# Patient Record
Sex: Male | Born: 1955 | Race: Black or African American | Hispanic: No | Marital: Single | Smoking: Former smoker
Health system: Southern US, Community
[De-identification: ages and names within clinical notes are randomized; demographics above are authoritative.]

## PROBLEM LIST (undated history)

## (undated) DIAGNOSIS — I10 Essential (primary) hypertension: Secondary | ICD-10-CM

## (undated) DIAGNOSIS — I739 Peripheral vascular disease, unspecified: Secondary | ICD-10-CM

## (undated) DIAGNOSIS — N183 Chronic kidney disease, stage 3 unspecified: Secondary | ICD-10-CM

## (undated) DIAGNOSIS — R51 Headache: Secondary | ICD-10-CM

## (undated) DIAGNOSIS — J189 Pneumonia, unspecified organism: Secondary | ICD-10-CM

## (undated) DIAGNOSIS — F419 Anxiety disorder, unspecified: Secondary | ICD-10-CM

## (undated) DIAGNOSIS — M1990 Unspecified osteoarthritis, unspecified site: Secondary | ICD-10-CM

## (undated) DIAGNOSIS — F329 Major depressive disorder, single episode, unspecified: Secondary | ICD-10-CM

## (undated) DIAGNOSIS — F32A Depression, unspecified: Secondary | ICD-10-CM

## (undated) DIAGNOSIS — E119 Type 2 diabetes mellitus without complications: Secondary | ICD-10-CM

## (undated) DIAGNOSIS — F319 Bipolar disorder, unspecified: Secondary | ICD-10-CM

## (undated) DIAGNOSIS — G4730 Sleep apnea, unspecified: Secondary | ICD-10-CM

## (undated) HISTORY — PX: JOINT REPLACEMENT: SHX530

## (undated) HISTORY — PX: AMPUTATION: SHX166

## (undated) HISTORY — PX: CARPAL TUNNEL RELEASE: SHX101

## (undated) HISTORY — PX: ULNAR NERVE REPAIR: SHX2594

## (undated) HISTORY — PX: EYE SURGERY: SHX253

---

## 1997-12-16 ENCOUNTER — Ambulatory Visit (HOSPITAL_COMMUNITY): Admission: RE | Admit: 1997-12-16 | Discharge: 1997-12-16 | Payer: Self-pay | Admitting: *Deleted

## 1998-02-06 ENCOUNTER — Ambulatory Visit (HOSPITAL_COMMUNITY): Admission: RE | Admit: 1998-02-06 | Discharge: 1998-02-06 | Payer: Self-pay | Admitting: *Deleted

## 1998-08-18 ENCOUNTER — Ambulatory Visit (HOSPITAL_COMMUNITY): Admission: RE | Admit: 1998-08-18 | Discharge: 1998-08-18 | Payer: Self-pay | Admitting: *Deleted

## 1999-02-18 ENCOUNTER — Ambulatory Visit (HOSPITAL_COMMUNITY): Admission: RE | Admit: 1999-02-18 | Discharge: 1999-02-18 | Payer: Self-pay | Admitting: Neurosurgery

## 1999-02-23 ENCOUNTER — Encounter: Admission: RE | Admit: 1999-02-23 | Discharge: 1999-05-24 | Payer: Self-pay | Admitting: Endocrinology

## 1999-04-20 ENCOUNTER — Observation Stay (HOSPITAL_COMMUNITY): Admission: RE | Admit: 1999-04-20 | Discharge: 1999-04-21 | Payer: Self-pay | Admitting: Neurosurgery

## 1999-08-11 ENCOUNTER — Encounter: Admission: RE | Admit: 1999-08-11 | Discharge: 1999-11-09 | Payer: Self-pay | Admitting: Neurosurgery

## 1999-09-07 ENCOUNTER — Encounter: Admission: RE | Admit: 1999-09-07 | Discharge: 1999-09-07 | Payer: Self-pay | Admitting: Neurosurgery

## 1999-10-15 ENCOUNTER — Ambulatory Visit (HOSPITAL_COMMUNITY): Admission: RE | Admit: 1999-10-15 | Discharge: 1999-10-15 | Payer: Self-pay | Admitting: Neurosurgery

## 1999-11-20 ENCOUNTER — Encounter: Admission: RE | Admit: 1999-11-20 | Discharge: 1999-11-20 | Payer: Self-pay | Admitting: Neurosurgery

## 2000-01-19 ENCOUNTER — Encounter: Admission: RE | Admit: 2000-01-19 | Discharge: 2000-01-19 | Payer: Self-pay | Admitting: Neurosurgery

## 2000-04-20 ENCOUNTER — Encounter: Admission: RE | Admit: 2000-04-20 | Discharge: 2000-04-20 | Payer: Self-pay | Admitting: Neurosurgery

## 2000-07-19 ENCOUNTER — Encounter: Admission: RE | Admit: 2000-07-19 | Discharge: 2000-07-19 | Payer: Self-pay | Admitting: Neurosurgery

## 2000-10-21 ENCOUNTER — Encounter: Admission: RE | Admit: 2000-10-21 | Discharge: 2000-10-21 | Payer: Self-pay | Admitting: Neurosurgery

## 2000-11-08 HISTORY — PX: ANTERIOR CERVICAL DECOMP/DISCECTOMY FUSION: SHX1161

## 2014-07-16 ENCOUNTER — Other Ambulatory Visit (HOSPITAL_COMMUNITY): Payer: Self-pay | Admitting: Neurosurgery

## 2014-08-03 NOTE — Pre-Procedure Instructions (Signed)
Ronald Atkinson  08/03/2014   Your procedure is scheduled on:  Mon, Oct 5 @ 9:30 AM  Report to [facility]  at 6:30 AM.  Call this number if you have problems the morning of surgery: [phone]   Remember:   Do not eat food or drink liquids after midnight.   Take these medicines the morning of surgery with A SIP OF WATER: Gabapentin,  Wellbutrin               No Goody's,BC's,Aleve,Aspirin,Ibuprofen,Fish Oil,or any Herbal Medications   Do not wear jewelry  Do not wear lotions, powders, or colognes. You may wear deodorant.             Men may shave face and neck.  Do not bring valuables to the hospital.  [facility] is not responsible  for any belongings or valuables.               Contacts, dentures or bridgework may not be worn into surgery.  Leave suitcase in the car. After surgery it may be brought to your room.  For patients admitted to the hospital, discharge time is determined by your treatment team.                  Special Instructions:  [place] - Preparing for Surgery  Before surgery, you can play an important role.  Because skin is not sterile, your skin needs to be as free of germs as possible.  You can reduce the number of germs on you skin by washing with CHG (chlorahexidine gluconate) soap before surgery.  CHG is an antiseptic cleaner which kills germs and bonds with the skin to continue killing germs even after washing.  Please DO NOT use if you have an allergy to CHG or antibacterial soaps.  If your skin becomes reddened/irritated stop using the CHG and inform your nurse when you arrive at Short Stay.  Do not shave (including legs and underarms) for at least 48 hours prior to the first CHG shower.  You may shave your face.  Please follow these instructions carefully:   1.  Shower with CHG Soap the night before surgery and the morning of Surgery.  2.  If you choose to wash your hair, wash your hair first as usual with your normal shampoo.  3.  After  you shampoo, rinse your hair and body thoroughly to remove the Shampoo.  4.  Use CHG as you would any other liquid soap.  You can apply chg directly to the skin and wash gently with scrungie or a clean washcloth.  5.  Apply the CHG Soap to your body ONLY FROM THE NECK DOWN.        Do not use on open wounds or open sores.  Avoid contact with your eyes, ears, mouth and genitals (private parts).  Wash genitals (private parts) with your normal soap.  6.  Wash thoroughly, paying special attention to the area where your surgery        will be performed.  7.  Thoroughly rinse your body with warm water from the neck down.  8.  DO NOT shower/wash with your normal soap after using and rinsing off the CHG Soap.  9.  Pat yourself dry with a clean towel.            10.  Wear clean pajamas.            11.  Place clean sheets on your bed  the night of your first shower and do not sleep with pets.  Day of Surgery  Do not apply any lotions/deoderants the morning of surgery.  Please wear clean clothes to the hospital/surgery center.     Please read over the following fact sheets that you were given: Pain Booklet, Coughing and Deep Breathing, MRSA Information and Surgical Site Infection Prevention

## 2014-08-05 ENCOUNTER — Encounter (HOSPITAL_COMMUNITY)
Admission: RE | Admit: 2014-08-05 | Discharge: 2014-08-05 | Disposition: A | Discharge status: Home / Self Care | Payer: Non-veteran care | Source: Ambulatory Visit | Attending: Neurosurgery | Admitting: Neurosurgery

## 2014-08-05 ENCOUNTER — Ambulatory Visit (HOSPITAL_COMMUNITY)
Admission: RE | Admit: 2014-08-05 | Discharge: 2014-08-05 | Disposition: A | Discharge status: Home / Self Care | Payer: Non-veteran care | Source: Ambulatory Visit | Attending: Neurosurgery | Admitting: Neurosurgery

## 2014-08-05 ENCOUNTER — Encounter (HOSPITAL_COMMUNITY): Payer: Self-pay

## 2014-08-05 DIAGNOSIS — Z01818 Encounter for other preprocedural examination: Secondary | ICD-10-CM | POA: Insufficient documentation

## 2014-08-05 HISTORY — DX: Headache: R51

## 2014-08-05 HISTORY — DX: Type 2 diabetes mellitus without complications: E11.9

## 2014-08-05 HISTORY — DX: Depression, unspecified: F32.A

## 2014-08-05 HISTORY — DX: Major depressive disorder, single episode, unspecified: F32.9

## 2014-08-05 HISTORY — DX: Unspecified osteoarthritis, unspecified site: M19.90

## 2014-08-05 LAB — BASIC METABOLIC PANEL
Anion gap: 12 (ref 5–15)
BUN: 13 mg/dL (ref 6–23)
CO2: 25 mEq/L (ref 19–32)
CREATININE: 0.92 mg/dL (ref 0.50–1.35)
Calcium: 9.2 mg/dL (ref 8.4–10.5)
Chloride: 102 mEq/L (ref 96–112)
GFR calc Af Amer: >90 mL/min (ref >90)
Glucose, Bld: 207 mg/dL — ABNORMAL HIGH (ref 70–99)
Potassium: 4.4 mEq/L (ref 3.7–5.3)
Sodium: 139 mEq/L (ref 137–147)

## 2014-08-05 LAB — CBC
HCT: 39 % (ref 39.0–52.0)
Hemoglobin: 13.3 g/dL (ref 13.0–17.0)
MCH: 28.7 pg (ref 26.0–34.0)
MCHC: 34.1 g/dL (ref 30.0–36.0)
MCV: 84.2 fL (ref 78.0–100.0)
PLATELETS: 283 10*3/uL (ref 150–400)
RBC: 4.63 MIL/uL (ref 4.22–5.81)
RDW: 15 % (ref 11.5–15.5)
WBC: 7.5 10*3/uL (ref 4.0–10.5)

## 2014-08-05 LAB — SURGICAL PCR SCREEN
MRSA, PCR: NEGATIVE
Staphylococcus aureus: NEGATIVE

## 2014-08-05 NOTE — Progress Notes (Signed)
08/05/14 1029  OBSTRUCTIVE SLEEP APNEA  Have you ever been diagnosed with sleep apnea through a sleep study? No  Do you snore loudly (loud enough to be heard through closed doors)?  1  Do you often feel tired, fatigued, or sleepy during the daytime? 1  Has anyone observed you stop breathing during your sleep? 0  Do you have, or are you being treated for high blood pressure? 0  BMI more than 35 kg/m2? 1  Age over 56 years old? 1  Neck circumference greater than 40 cm/16 inches? 1  Gender: 1  Obstructive Sleep Apnea Score 6  Score 4 or greater  Results sent to PCP

## 2014-08-06 NOTE — Progress Notes (Signed)
Anesthesia Chart Review: Patient is a 58 year old male scheduled for C4-5 ACDF on 08/12/14 by Dr. Lovell SheehanJenkins.  History includes non-smoker, DM2, arthritis, headaches, depression.  BMI is consistent with obesity. No PCP is listed.   Preoperative CXR and labs noted. He will get a fasting CBG on arrival.  EKG on 08/05/14 showed: NSR, LAD, consider LAFB. Moderate voltage criteria for LVH, may be a normal variant.  Non-specific T wave abnormality. Currently, the last comparison EKG is in [place] and is from 04/20/99. LAD is new since then. No CV symptoms were documented from his PAT visit. He will be further evaluated by his assigned anesthesiologist on the day of surgery, but if no acute changes or new CV symptoms then I would anticipate that he could proceed as planned.  Velna Ochsllison Quaniya Damas, PA-C [facility]Anesthesiology Phone [phone] 08/06/2014 5:12 PM

## 2014-08-11 MED ORDER — DEXTROSE 5 % IV SOLN
3.0000 g | INTRAVENOUS | Status: DC
  Filled 2014-08-11: qty 3000

## 2014-08-11 NOTE — Anesthesia Preprocedure Evaluation (Addendum)
Anesthesia Evaluation  Patient identified by MRN, date of birth, ID band Patient awake    Reviewed: Allergy & Precautions, H&P , NPO status , Patient's Chart, lab work & pertinent test results  Airway Mallampati: II      Dental  (+) Partial Upper   Pulmonary  breath sounds clear to auscultation        Cardiovascular Rhythm:Regular Rate:Normal  Denies symptoms   Neuro/Psych  Headaches, Depression    GI/Hepatic   Endo/Other  diabetes, Poorly Controlled, Type 2, Insulin Dependent  Renal/GU      Musculoskeletal   Abdominal (+) + obese,   Peds  Hematology   Anesthesia Other Findings   Reproductive/Obstetrics                          Anesthesia Physical Anesthesia Plan  ASA: III  Anesthesia Plan: General   Post-op Pain Management:    Induction: Intravenous  Airway Management Planned: Oral ETT and Video Laryngoscope Planned  Additional Equipment:   Intra-op Plan:   Post-operative Plan: Extubation in OR  Informed Consent: I have reviewed the patients History and Physical, chart, labs and discussed the procedure including the risks, benefits and alternatives for the proposed anesthesia with the patient or authorized representative who has indicated his/her understanding and acceptance.     Plan Discussed with:   Anesthesia Plan Comments: (Glide scope, suspect OSA, nasal trumpet after intubation for emergence)        Anesthesia Quick Evaluation

## 2014-08-12 ENCOUNTER — Ambulatory Visit (HOSPITAL_COMMUNITY): Payer: Non-veteran care | Admitting: Anesthesiology

## 2014-08-12 ENCOUNTER — Encounter (HOSPITAL_COMMUNITY)
Admission: RE | Disposition: A | Discharge status: Home / Self Care | Payer: Self-pay | Source: Ambulatory Visit | Attending: Neurosurgery

## 2014-08-12 ENCOUNTER — Encounter (HOSPITAL_COMMUNITY): Payer: Non-veteran care | Admitting: Vascular Surgery

## 2014-08-12 ENCOUNTER — Ambulatory Visit (HOSPITAL_COMMUNITY)
Admission: RE | Admit: 2014-08-12 | Discharge: 2014-08-13 | Disposition: A | Discharge status: Home / Self Care | Payer: Non-veteran care | Source: Ambulatory Visit | Attending: Neurosurgery | Admitting: Neurosurgery

## 2014-08-12 ENCOUNTER — Ambulatory Visit (HOSPITAL_COMMUNITY): Payer: Non-veteran care

## 2014-08-12 ENCOUNTER — Encounter (HOSPITAL_COMMUNITY): Payer: Self-pay | Admitting: Surgery

## 2014-08-12 DIAGNOSIS — M4712 Other spondylosis with myelopathy, cervical region: Secondary | ICD-10-CM | POA: Diagnosis not present

## 2014-08-12 DIAGNOSIS — M4802 Spinal stenosis, cervical region: Secondary | ICD-10-CM | POA: Insufficient documentation

## 2014-08-12 DIAGNOSIS — M5012 Cervical disc disorder with radiculopathy, mid-cervical region: Secondary | ICD-10-CM | POA: Diagnosis not present

## 2014-08-12 DIAGNOSIS — F329 Major depressive disorder, single episode, unspecified: Secondary | ICD-10-CM | POA: Diagnosis not present

## 2014-08-12 DIAGNOSIS — E119 Type 2 diabetes mellitus without complications: Secondary | ICD-10-CM | POA: Insufficient documentation

## 2014-08-12 DIAGNOSIS — M4722 Other spondylosis with radiculopathy, cervical region: Secondary | ICD-10-CM | POA: Insufficient documentation

## 2014-08-12 DIAGNOSIS — E669 Obesity, unspecified: Secondary | ICD-10-CM | POA: Diagnosis not present

## 2014-08-12 DIAGNOSIS — M5002 Cervical disc disorder with myelopathy, mid-cervical region: Secondary | ICD-10-CM | POA: Diagnosis not present

## 2014-08-12 DIAGNOSIS — Z794 Long term (current) use of insulin: Secondary | ICD-10-CM | POA: Diagnosis not present

## 2014-08-12 DIAGNOSIS — Z981 Arthrodesis status: Secondary | ICD-10-CM | POA: Diagnosis not present

## 2014-08-12 HISTORY — PX: ANTERIOR CERVICAL DECOMP/DISCECTOMY FUSION: SHX1161

## 2014-08-12 LAB — GLUCOSE, CAPILLARY
GLUCOSE-CAPILLARY: 180 mg/dL — AB (ref 70–99)
GLUCOSE-CAPILLARY: 231 mg/dL — AB (ref 70–99)
GLUCOSE-CAPILLARY: 251 mg/dL — AB (ref 70–99)
Glucose-Capillary: 108 mg/dL — ABNORMAL HIGH (ref 70–99)
Glucose-Capillary: 106 mg/dL — ABNORMAL HIGH (ref 70–99)

## 2014-08-12 SURGERY — ANTERIOR CERVICAL DECOMPRESSION/DISCECTOMY FUSION 1 LEVEL/HARDWARE REMOVAL
Anesthesia: General

## 2014-08-12 MED ORDER — LIDOCAINE HCL (CARDIAC) 20 MG/ML IV SOLN
INTRAVENOUS | Status: AC
  Filled 2014-08-12: qty 5

## 2014-08-12 MED ORDER — ACETAMINOPHEN 325 MG PO TABS: 650.0000 mg | ORAL_TABLET | ORAL | Status: DC | PRN

## 2014-08-12 MED ORDER — DEXAMETHASONE SODIUM PHOSPHATE 4 MG/ML IJ SOLN
INTRAMUSCULAR | Status: DC | PRN
  Administered 2014-08-12: 8 mg via INTRAVENOUS

## 2014-08-12 MED ORDER — ONDANSETRON HCL 4 MG/2ML IJ SOLN
INTRAMUSCULAR | Status: DC | PRN
  Administered 2014-08-12: 4 mg via INTRAVENOUS

## 2014-08-12 MED ORDER — SODIUM CHLORIDE 0.9 % IJ SOLN
INTRAMUSCULAR | Status: AC
  Filled 2014-08-12: qty 10

## 2014-08-12 MED ORDER — NEOSTIGMINE METHYLSULFATE 10 MG/10ML IV SOLN
INTRAVENOUS | Status: AC
  Filled 2014-08-12: qty 1

## 2014-08-12 MED ORDER — SODIUM CHLORIDE 0.9 % IV SOLN
10.0000 mg | INTRAVENOUS | Status: DC | PRN
  Administered 2014-08-12: 25 ug/min via INTRAVENOUS

## 2014-08-12 MED ORDER — MENTHOL 3 MG MT LOZG: 1.0000 | LOZENGE | OROMUCOSAL | Status: DC | PRN

## 2014-08-12 MED ORDER — DEXAMETHASONE SODIUM PHOSPHATE 4 MG/ML IJ SOLN: 4.0000 mg | Freq: Four times a day (QID) | INTRAMUSCULAR | Status: AC

## 2014-08-12 MED ORDER — FENTANYL CITRATE 0.05 MG/ML IJ SOLN
INTRAMUSCULAR | Status: AC
  Filled 2014-08-12: qty 5

## 2014-08-12 MED ORDER — GABAPENTIN 800 MG PO TABS
800.0000 mg | ORAL_TABLET | Freq: Two times a day (BID) | ORAL | Status: DC
  Filled 2014-08-12: qty 1

## 2014-08-12 MED ORDER — MEPERIDINE HCL 25 MG/ML IJ SOLN: 6.2500 mg | INTRAMUSCULAR | Status: DC | PRN

## 2014-08-12 MED ORDER — ACETAMINOPHEN 10 MG/ML IV SOLN
INTRAVENOUS | Status: AC
  Administered 2014-08-12: 1000 mg via INTRAVENOUS
  Filled 2014-08-12: qty 100

## 2014-08-12 MED ORDER — MIDAZOLAM HCL 2 MG/2ML IJ SOLN
INTRAMUSCULAR | Status: AC
  Filled 2014-08-12: qty 2

## 2014-08-12 MED ORDER — PROPOFOL 10 MG/ML IV BOLUS
INTRAVENOUS | Status: AC
  Filled 2014-08-12: qty 20

## 2014-08-12 MED ORDER — SUCCINYLCHOLINE CHLORIDE 20 MG/ML IJ SOLN
INTRAMUSCULAR | Status: AC
  Filled 2014-08-12: qty 1

## 2014-08-12 MED ORDER — INSULIN ASPART 100 UNIT/ML ~~LOC~~ SOLN
0.0000 [IU] | SUBCUTANEOUS | Status: DC
  Administered 2014-08-12: 7 [IU] via SUBCUTANEOUS
  Administered 2014-08-12: 11 [IU] via SUBCUTANEOUS
  Administered 2014-08-13: 4 [IU] via SUBCUTANEOUS
  Administered 2014-08-13: 7 [IU] via SUBCUTANEOUS

## 2014-08-12 MED ORDER — PROPOFOL 10 MG/ML IV BOLUS
INTRAVENOUS | Status: DC | PRN
  Administered 2014-08-12: 200 mg via INTRAVENOUS

## 2014-08-12 MED ORDER — BUPIVACAINE-EPINEPHRINE 0.5% -1:200000 IJ SOLN
INTRAMUSCULAR | Status: DC | PRN
  Administered 2014-08-12: 10 mL

## 2014-08-12 MED ORDER — DOCUSATE SODIUM 100 MG PO CAPS
100.0000 mg | ORAL_CAPSULE | Freq: Two times a day (BID) | ORAL | Status: DC
  Administered 2014-08-12: 100 mg via ORAL
  Filled 2014-08-12 (×3): qty 1

## 2014-08-12 MED ORDER — DEXAMETHASONE SODIUM PHOSPHATE 4 MG/ML IJ SOLN
INTRAMUSCULAR | Status: AC
  Filled 2014-08-12: qty 2

## 2014-08-12 MED ORDER — 0.9 % SODIUM CHLORIDE (POUR BTL) OPTIME
TOPICAL | Status: DC | PRN
  Administered 2014-08-12: 1000 mL

## 2014-08-12 MED ORDER — GLYCOPYRROLATE 0.2 MG/ML IJ SOLN
INTRAMUSCULAR | Status: AC
  Filled 2014-08-12: qty 3

## 2014-08-12 MED ORDER — ROCURONIUM BROMIDE 100 MG/10ML IV SOLN
INTRAVENOUS | Status: DC | PRN
  Administered 2014-08-12: 50 mg via INTRAVENOUS
  Administered 2014-08-12: 20 mg via INTRAVENOUS

## 2014-08-12 MED ORDER — GLYCOPYRROLATE 0.2 MG/ML IJ SOLN
INTRAMUSCULAR | Status: DC | PRN
  Administered 2014-08-12: .6 mg via INTRAVENOUS

## 2014-08-12 MED ORDER — BACITRACIN ZINC 500 UNIT/GM EX OINT
TOPICAL_OINTMENT | CUTANEOUS | Status: DC | PRN
  Administered 2014-08-12: 1 via TOPICAL

## 2014-08-12 MED ORDER — HYDROCODONE-ACETAMINOPHEN 5-325 MG PO TABS
1.0000 | ORAL_TABLET | ORAL | Status: DC | PRN
  Administered 2014-08-12: 2 via ORAL

## 2014-08-12 MED ORDER — VANCOMYCIN HCL IN DEXTROSE 1-5 GM/200ML-% IV SOLN
1000.0000 mg | Freq: Two times a day (BID) | INTRAVENOUS | Status: DC
  Administered 2014-08-12: 1000 mg via INTRAVENOUS
  Filled 2014-08-12 (×3): qty 200

## 2014-08-12 MED ORDER — ONDANSETRON HCL 4 MG/2ML IJ SOLN
INTRAMUSCULAR | Status: AC
  Filled 2014-08-12: qty 2

## 2014-08-12 MED ORDER — PHENYLEPHRINE HCL 10 MG/ML IJ SOLN
INTRAMUSCULAR | Status: DC | PRN
  Administered 2014-08-12: 120 ug via INTRAVENOUS
  Administered 2014-08-12 (×4): 80 ug via INTRAVENOUS
  Administered 2014-08-12: 120 ug via INTRAVENOUS
  Administered 2014-08-12: 80 ug via INTRAVENOUS
  Administered 2014-08-12: 120 ug via INTRAVENOUS

## 2014-08-12 MED ORDER — DIAZEPAM 5 MG PO TABS
5.0000 mg | ORAL_TABLET | Freq: Four times a day (QID) | ORAL | Status: DC | PRN
  Administered 2014-08-12 – 2014-08-13 (×3): 5 mg via ORAL
  Filled 2014-08-12 (×2): qty 1

## 2014-08-12 MED ORDER — PROMETHAZINE HCL 25 MG/ML IJ SOLN: 6.2500 mg | INTRAMUSCULAR | Status: DC | PRN

## 2014-08-12 MED ORDER — LACTATED RINGERS IV SOLN: INTRAVENOUS | Status: DC

## 2014-08-12 MED ORDER — ONDANSETRON HCL 4 MG/2ML IJ SOLN: 4.0000 mg | INTRAMUSCULAR | Status: DC | PRN

## 2014-08-12 MED ORDER — PHENOL 1.4 % MT LIQD: 1.0000 | OROMUCOSAL | Status: DC | PRN

## 2014-08-12 MED ORDER — HEMOSTATIC AGENTS (NO CHARGE) OPTIME
TOPICAL | Status: DC | PRN
  Administered 2014-08-12: 1 via TOPICAL

## 2014-08-12 MED ORDER — FENTANYL CITRATE 0.05 MG/ML IJ SOLN
INTRAMUSCULAR | Status: AC
  Filled 2014-08-12: qty 2

## 2014-08-12 MED ORDER — DIAZEPAM 5 MG PO TABS
ORAL_TABLET | ORAL | Status: AC
  Filled 2014-08-12: qty 1

## 2014-08-12 MED ORDER — MORPHINE SULFATE 2 MG/ML IJ SOLN
1.0000 mg | INTRAMUSCULAR | Status: DC | PRN
  Administered 2014-08-13: 4 mg via INTRAVENOUS
  Filled 2014-08-12: qty 2

## 2014-08-12 MED ORDER — PHENYLEPHRINE 40 MCG/ML (10ML) SYRINGE FOR IV PUSH (FOR BLOOD PRESSURE SUPPORT)
PREFILLED_SYRINGE | INTRAVENOUS | Status: AC
  Filled 2014-08-12: qty 10

## 2014-08-12 MED ORDER — LACTATED RINGERS IV SOLN
INTRAVENOUS | Status: DC
  Administered 2014-08-12 (×3): via INTRAVENOUS

## 2014-08-12 MED ORDER — LIDOCAINE HCL (CARDIAC) 20 MG/ML IV SOLN
INTRAVENOUS | Status: DC | PRN
  Administered 2014-08-12: 100 mg via INTRAVENOUS

## 2014-08-12 MED ORDER — HYDROCODONE-ACETAMINOPHEN 5-325 MG PO TABS
ORAL_TABLET | ORAL | Status: AC
  Filled 2014-08-12: qty 2

## 2014-08-12 MED ORDER — ACETAMINOPHEN 650 MG RE SUPP: 650.0000 mg | RECTAL | Status: DC | PRN

## 2014-08-12 MED ORDER — BUPROPION HCL 100 MG PO TABS
100.0000 mg | ORAL_TABLET | Freq: Two times a day (BID) | ORAL | Status: DC
  Administered 2014-08-12: 100 mg via ORAL
  Filled 2014-08-12 (×3): qty 1

## 2014-08-12 MED ORDER — SODIUM CHLORIDE 0.9 % IR SOLN
Status: DC | PRN
  Administered 2014-08-12: 10:00:00

## 2014-08-12 MED ORDER — VANCOMYCIN HCL IN DEXTROSE 1-5 GM/200ML-% IV SOLN
INTRAVENOUS | Status: AC
  Administered 2014-08-12: 1000 mg via INTRAVENOUS
  Filled 2014-08-12: qty 200

## 2014-08-12 MED ORDER — NEOSTIGMINE METHYLSULFATE 10 MG/10ML IV SOLN
INTRAVENOUS | Status: DC | PRN
  Administered 2014-08-12: 3 mg via INTRAVENOUS

## 2014-08-12 MED ORDER — ROCURONIUM BROMIDE 50 MG/5ML IV SOLN
INTRAVENOUS | Status: AC
  Filled 2014-08-12: qty 1

## 2014-08-12 MED ORDER — OXYCODONE-ACETAMINOPHEN 5-325 MG PO TABS
1.0000 | ORAL_TABLET | ORAL | Status: DC | PRN
  Administered 2014-08-12: 2 via ORAL
  Administered 2014-08-12: 1 via ORAL
  Administered 2014-08-12 – 2014-08-13 (×2): 2 via ORAL
  Filled 2014-08-12 (×4): qty 2

## 2014-08-12 MED ORDER — METFORMIN HCL 500 MG PO TABS
1000.0000 mg | ORAL_TABLET | Freq: Two times a day (BID) | ORAL | Status: DC
  Administered 2014-08-12 – 2014-08-13 (×2): 1000 mg via ORAL
  Filled 2014-08-12 (×4): qty 2

## 2014-08-12 MED ORDER — MIDAZOLAM HCL 5 MG/5ML IJ SOLN
INTRAMUSCULAR | Status: DC | PRN
  Administered 2014-08-12: 2 mg via INTRAVENOUS

## 2014-08-12 MED ORDER — EPHEDRINE SULFATE 50 MG/ML IJ SOLN
INTRAMUSCULAR | Status: AC
  Filled 2014-08-12: qty 1

## 2014-08-12 MED ORDER — ALUM & MAG HYDROXIDE-SIMETH 200-200-20 MG/5ML PO SUSP: 30.0000 mL | Freq: Four times a day (QID) | ORAL | Status: DC | PRN

## 2014-08-12 MED ORDER — INFLUENZA VAC SPLIT QUAD 0.5 ML IM SUSY
0.5000 mL | PREFILLED_SYRINGE | INTRAMUSCULAR | Status: DC
  Filled 2014-08-12: qty 0.5

## 2014-08-12 MED ORDER — DEXAMETHASONE 4 MG PO TABS
4.0000 mg | ORAL_TABLET | Freq: Four times a day (QID) | ORAL | Status: AC
  Administered 2014-08-12 (×2): 4 mg via ORAL
  Filled 2014-08-12 (×2): qty 1

## 2014-08-12 MED ORDER — THROMBIN 5000 UNITS EX SOLR
CUTANEOUS | Status: DC | PRN
  Administered 2014-08-12 (×2): 5000 [IU] via TOPICAL

## 2014-08-12 MED ORDER — FENTANYL CITRATE 0.05 MG/ML IJ SOLN
25.0000 ug | INTRAMUSCULAR | Status: DC | PRN
  Administered 2014-08-12 (×2): 25 ug via INTRAVENOUS
  Administered 2014-08-12: 50 ug via INTRAVENOUS

## 2014-08-12 MED ORDER — GABAPENTIN 400 MG PO CAPS
800.0000 mg | ORAL_CAPSULE | Freq: Two times a day (BID) | ORAL | Status: DC
  Administered 2014-08-12: 800 mg via ORAL
  Filled 2014-08-12 (×3): qty 2

## 2014-08-12 MED ORDER — TRAZODONE HCL 100 MG PO TABS
100.0000 mg | ORAL_TABLET | Freq: Every day | ORAL | Status: DC
  Filled 2014-08-12 (×2): qty 1

## 2014-08-12 MED ORDER — FENTANYL CITRATE 0.05 MG/ML IJ SOLN
INTRAMUSCULAR | Status: DC | PRN
  Administered 2014-08-12: 100 ug via INTRAVENOUS
  Administered 2014-08-12: 50 ug via INTRAVENOUS

## 2014-08-12 MED ORDER — ARTIFICIAL TEARS OP OINT
TOPICAL_OINTMENT | OPHTHALMIC | Status: AC
  Filled 2014-08-12: qty 3.5

## 2014-08-12 SURGICAL SUPPLY — 70 items
APL SKNCLS STERI-STRIP NONHPOA (GAUZE/BANDAGES/DRESSINGS) ×1
BAG DECANTER FOR FLEXI CONT (MISCELLANEOUS) ×3 IMPLANT
BENZOIN TINCTURE PRP APPL 2/3 (GAUZE/BANDAGES/DRESSINGS) ×4 IMPLANT
BIT DRILL NEURO 2X3.1 SFT TUCH (MISCELLANEOUS) ×1 IMPLANT
BLADE SURG 15 STRL LF DISP TIS (BLADE) ×1 IMPLANT
BLADE SURG 15 STRL SS (BLADE) ×3
BLADE ULTRA TIP 2M (BLADE) ×3 IMPLANT
BRUSH SCRUB EZ PLAIN DRY (MISCELLANEOUS) ×3 IMPLANT
BUR BARREL STRAIGHT FLUTE 4.0 (BURR) ×3 IMPLANT
BUR MATCHSTICK NEURO 3.0 LAGG (BURR) ×3 IMPLANT
CANISTER SUCT 3000ML (MISCELLANEOUS) ×3 IMPLANT
CLOSURE WOUND 1/2 X4 (GAUZE/BANDAGES/DRESSINGS) ×1
CONT SPEC 4OZ CLIKSEAL STRL BL (MISCELLANEOUS) ×3 IMPLANT
COVER MAYO STAND STRL (DRAPES) ×3 IMPLANT
DRAIN JACKSON PRATT 10MM FLAT (MISCELLANEOUS) ×2 IMPLANT
DRAPE LAPAROTOMY 100X72 PEDS (DRAPES) ×3 IMPLANT
DRAPE MICROSCOPE LEICA (MISCELLANEOUS) IMPLANT
DRAPE POUCH INSTRU U-SHP 10X18 (DRAPES) ×3 IMPLANT
DRAPE SURG 17X23 STRL (DRAPES) ×6 IMPLANT
DRILL NEURO 2X3.1 SOFT TOUCH (MISCELLANEOUS) ×3
ELECT BLADE 4.0 EZ CLEAN MEGAD (MISCELLANEOUS) ×6
ELECT REM PT RETURN 9FT ADLT (ELECTROSURGICAL) ×3
ELECTRODE BLDE 4.0 EZ CLN MEGD (MISCELLANEOUS) IMPLANT
ELECTRODE REM PT RTRN 9FT ADLT (ELECTROSURGICAL) ×1 IMPLANT
EVACUATOR SILICONE 100CC (DRAIN) ×2 IMPLANT
GAUZE SPONGE 4X4 12PLY STRL (GAUZE/BANDAGES/DRESSINGS) ×3 IMPLANT
GAUZE SPONGE 4X4 16PLY XRAY LF (GAUZE/BANDAGES/DRESSINGS) IMPLANT
GLOVE BIO SURGEON STRL SZ8 (GLOVE) ×6 IMPLANT
GLOVE BIO SURGEON STRL SZ8.5 (GLOVE) ×3 IMPLANT
GLOVE BIOGEL PI IND STRL 7.5 (GLOVE) IMPLANT
GLOVE BIOGEL PI INDICATOR 7.5 (GLOVE) ×6
GLOVE EXAM NITRILE LRG STRL (GLOVE) IMPLANT
GLOVE EXAM NITRILE MD LF STRL (GLOVE) IMPLANT
GLOVE EXAM NITRILE XL STR (GLOVE) IMPLANT
GLOVE EXAM NITRILE XS STR PU (GLOVE) IMPLANT
GLOVE SS BIOGEL STRL SZ 8 (GLOVE) ×1 IMPLANT
GLOVE SS BIOGEL STRL SZ 8.5 (GLOVE) IMPLANT
GLOVE SUPERSENSE BIOGEL SZ 8 (GLOVE) ×2
GLOVE SUPERSENSE BIOGEL SZ 8.5 (GLOVE) ×2
GLOVE SURG SS PI 7.0 STRL IVOR (GLOVE) ×6 IMPLANT
GOWN STRL REUS W/ TWL LRG LVL3 (GOWN DISPOSABLE) IMPLANT
GOWN STRL REUS W/ TWL XL LVL3 (GOWN DISPOSABLE) ×1 IMPLANT
GOWN STRL REUS W/TWL LRG LVL3 (GOWN DISPOSABLE)
GOWN STRL REUS W/TWL XL LVL3 (GOWN DISPOSABLE) ×3
IMPLANT PEEK ACIS STND 9MM (Cage) ×2 IMPLANT
KIT BASIN OR (CUSTOM PROCEDURE TRAY) ×3 IMPLANT
KIT ROOM TURNOVER OR (KITS) ×3 IMPLANT
MARKER SKIN DUAL TIP RULER LAB (MISCELLANEOUS) ×3 IMPLANT
NDL SPNL 18GX3.5 QUINCKE PK (NEEDLE) ×1 IMPLANT
NEEDLE HYPO 22GX1.5 SAFETY (NEEDLE) ×3 IMPLANT
NEEDLE SPNL 18GX3.5 QUINCKE PK (NEEDLE) ×3 IMPLANT
NS IRRIG 1000ML POUR BTL (IV SOLUTION) ×3 IMPLANT
PACK LAMINECTOMY NEURO (CUSTOM PROCEDURE TRAY) ×3 IMPLANT
PATTIES SURGICAL .5 X.5 (GAUZE/BANDAGES/DRESSINGS) ×2 IMPLANT
PIN DISTRACTION 14MM (PIN) ×6 IMPLANT
PLATE SKYLINE ONE LEVEL 18MM (Plate) ×2 IMPLANT
PUTTY BIOACTIVE 2CC KINEX (Miscellaneous) ×2 IMPLANT
RUBBERBAND STERILE (MISCELLANEOUS) IMPLANT
SCREW SKYLINE VAR OS 14MM (Screw) ×2 IMPLANT
SCREW VAR SELF TAP SKYLINE 14M (Screw) ×6 IMPLANT
SPONGE INTESTINAL PEANUT (DISPOSABLE) ×6 IMPLANT
SPONGE SURGIFOAM ABS GEL SZ50 (HEMOSTASIS) ×3 IMPLANT
STRIP CLOSURE SKIN 1/2X4 (GAUZE/BANDAGES/DRESSINGS) ×2 IMPLANT
SUT VIC AB 0 CT1 27 (SUTURE) ×3
SUT VIC AB 0 CT1 27XBRD ANTBC (SUTURE) ×1 IMPLANT
SUT VIC AB 3-0 SH 8-18 (SUTURE) ×3 IMPLANT
SYR 20ML ECCENTRIC (SYRINGE) ×3 IMPLANT
TOWEL OR 17X24 6PK STRL BLUE (TOWEL DISPOSABLE) ×3 IMPLANT
TOWEL OR 17X26 10 PK STRL BLUE (TOWEL DISPOSABLE) ×3 IMPLANT
WATER STERILE IRR 1000ML POUR (IV SOLUTION) ×3 IMPLANT

## 2014-08-12 NOTE — H&P (Signed)
Subjective: The patient is a 58 year old black male on whom I previously performed a C5-6 and C6-7 anterior cervical discectomy, fusion, and plating. He has done well but has developed recurrent neck and arm pain consistent with a cervical radiculopathy. He failed medical management and was worked up with a cervical MRI which demonstrated significant stenosis at C4-5. I discussed the various treatment options with the patient including surgery. He has weighed the risks, benefits, and alternatives surgery and decided proceed with a C4-5 intracervical discectomy, fusion, plating with removal of his old plate.   Past Medical History  Diagnosis Date  . Diabetes mellitus without complication     takes pills and insulin  . Depression   . Headache(784.0)     cluster headaches  . Arthritis     Past Surgical History  Procedure Laterality Date  . Ulnar nerve repair      right shoulder and neck  . Eye surgery      bil cataracts    Allergies  Allergen Reactions  . Bee Venom Anaphylaxis and Hives  . Penicillins Shortness Of Breath    Also gets rash    History  Substance Use Topics  . Smoking status: Never Smoker   . Smokeless tobacco: Not on file  . Alcohol Use: No    History reviewed. No pertinent family history. Prior to Admission medications   Medication Sig Start Date End Date Taking? Authorizing Provider  bisacodyl (DULCOLAX) 5 MG EC tablet Take 5 mg by mouth 2 (two) times daily.   Yes Historical Provider, MD  buPROPion (WELLBUTRIN) 100 MG tablet Take 100 mg by mouth 2 (two) times daily.   Yes Historical Provider, MD  cyclobenzaprine (FLEXERIL) 10 MG tablet Take 10 mg by mouth at bedtime.   Yes Historical Provider, MD  gabapentin (NEURONTIN) 800 MG tablet Take 800 mg by mouth 2 (two) times daily.   Yes Historical Provider, MD  insulin aspart (NOVOLOG) 100 UNIT/ML injection Inject 0-10 Units into the skin 3 (three) times daily before meals. SLIDING SCALE   Yes Historical Provider, MD   insulin glargine (LANTUS) 100 UNIT/ML injection Inject 50 Units into the skin at bedtime.    Yes Historical Provider, MD  meloxicam (MOBIC) 15 MG tablet Take 15 mg by mouth daily.   Yes Historical Provider, MD  metFORMIN (GLUCOPHAGE) 1000 MG tablet Take 1,000 mg by mouth 2 (two) times daily with a meal.   Yes Historical Provider, MD  traZODone (DESYREL) 100 MG tablet Take 100 mg by mouth at bedtime.   Yes Historical Provider, MD     Review of Systems  Positive ROS: As above  All other systems have been reviewed and were otherwise negative with the exception of those mentioned in the HPI and as above.  Objective: Vital signs in last 24 hours: Temp:  [98.5 F (36.9 C)] 98.5 F (36.9 C) (10/05 0707) Pulse Rate:  [83] 83 (10/05 0707) Resp:  [18] 18 (10/05 0707) BP: (157)/(85) 157/85 mmHg (10/05 0707) SpO2:  [99 %] 99 % (10/05 0707) Weight:  [125.646 kg (277 lb)] 125.646 kg (277 lb) (10/05 0707)  General Appearance: Alert, cooperative, no distress, Head: Normocephalic, without obvious abnormality, atraumatic Eyes: PERRL, conjunctiva/corneas clear, EOM's intact,    Ears: Normal  Throat: Normal  Neck: Supple, symmetrical, trachea midline, no adenopathy; thyroid: No enlargement/tenderness/nodules; no carotid bruit or JVD. His cervical incision is well-healed. Back: Symmetric, no curvature, ROM normal, no CVA tenderness Lungs: Clear to auscultation bilaterally, respirations unlabored Heart: Regular rate  and rhythm, no murmur, rub or gallop Abdomen: Soft, non-tender,, no masses, no organomegaly Extremities: Extremities normal, atraumatic, no cyanosis or edema Pulses: 2+ and symmetric all extremities Skin: Skin color, texture, turgor normal, no rashes or lesions  NEUROLOGIC:   Mental status: alert and oriented, no aphasia, good attention span, Fund of knowledge/ memory ok Motor Exam - grossly normal Sensory Exam - grossly normal Reflexes:  Coordination - grossly normal Gait -  grossly normal Balance - grossly normal Cranial Nerves: I: smell Not tested  II: visual acuity  OS: Normal  OD: Normal   II: visual fields Full to confrontation  II: pupils Equal, round, reactive to light  III,VII: ptosis None  III,IV,VI: extraocular muscles  Full ROM  V: mastication Normal  V: facial light touch sensation  Normal  V,VII: corneal reflex  Present  VII: facial muscle function - upper  Normal  VII: facial muscle function - lower Normal  VIII: hearing Not tested  IX: soft palate elevation  Normal  IX,X: gag reflex Present  XI: trapezius strength  5/5  XI: sternocleidomastoid strength 5/5  XI: neck flexion strength  5/5  XII: tongue strength  Normal    Data Review Lab Results  Component Value Date   WBC 7.5 08/05/2014   HGB 13.3 08/05/2014   HCT 39.0 08/05/2014   MCV 84.2 08/05/2014   PLT 283 08/05/2014   Lab Results  Component Value Date   NA 139 08/05/2014   K 4.4 08/05/2014   CL 102 08/05/2014   CO2 25 08/05/2014   BUN 13 08/05/2014   CREATININE 0.92 08/05/2014   GLUCOSE 207* 08/05/2014   No results found for this basename: INR, PROTIME    Assessment/Plan: 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 I have reviewed his imaging studies with them and pointed out the abnormalities. We have discussed the various treatment options including surgery. I described the surgical treatment option of a C4-5 intracervical discectomy, fusion, and plating with removal of his old plate and screws. I described the surgery to them. I've shown him surgical models. We have discussed the risks, benefits, alternatives, and likelihood of achieving our goals with surgery. I have answered all the patient's questions. He has decided proceed with surgery.   Ryland Smoots D 08/12/2014 9:26 AM

## 2014-08-12 NOTE — Transfer of Care (Signed)
Immediate Anesthesia Transfer of Care Note  Patient: Ronald Atkinson  Procedure(s) Performed: Procedure(s): Cervical four/five anterior cervical decompression with fusion interbody prosthesis plating and bonegraft with removal of old plate (N/A)  Patient Location: PACU  Anesthesia Type:General  Level of Consciousness: awake, alert  and oriented  Airway & Oxygen Therapy: Patient Spontanous Breathing and Patient connected to nasal cannula oxygen  Post-op Assessment: Report given to PACU RN and Post -op Vital signs reviewed and stable  Post vital signs: Reviewed and stable  Complications: No apparent anesthesia complications

## 2014-08-12 NOTE — Progress Notes (Signed)
ANTIBIOTIC CONSULT NOTE - INITIAL  Pharmacy Consult for vancomycin Indication: surgical prophylaxis  Allergies  Allergen Reactions  . Bee Venom Anaphylaxis and Hives  . Penicillins Shortness Of Breath    Also gets rash    Patient Measurements: Weight: 277 lb (125.646 kg) Adjusted Body Weight:   Vital Signs: Temp: 98.8 F (37.1 C) (10/05 1425) Temp Source: Oral (10/05 0707) BP: 157/79 mmHg (10/05 1425) Pulse Rate: 90 (10/05 1425) Intake/Output from previous day:   Intake/Output from this shift: Total I/O In: 1908 [I.V.:1900; Other:8] Out: -   Labs: No results found for this basename: WBC, HGB, PLT, LABCREA, CREATININE,  in the last 72 hours CrCl is unknown because there is no height on file for the current visit. No results found for this basename: VANCOTROUGH, Leodis BinetVANCOPEAK, VANCORANDOM, GENTTROUGH, GENTPEAK, GENTRANDOM, TOBRATROUGH, TOBRAPEAK, TOBRARND, AMIKACINPEAK, AMIKACINTROU, AMIKACIN,  in the last 72 hours   Microbiology: Recent Results (from the past 720 hour(s))  SURGICAL PCR SCREEN     Status: None   Collection Time    08/05/14 11:17 AM      Result Value Ref Range Status   MRSA, PCR NEGATIVE  NEGATIVE Final   Staphylococcus aureus NEGATIVE  NEGATIVE Final   Comment:            The Xpert SA Assay (FDA     approved for NASAL specimens     in patients over 56 years of age),     is one component of     a comprehensive surveillance     program.  Test performance has     been validated by [facility] for patients greater     than or equal to 56 year old.     It is not intended     to diagnose infection nor to     guide or monitor treatment.    Medical History: Past Medical History  Diagnosis Date  . Diabetes mellitus without complication     takes pills and insulin  . Depression   . Headache(784.0)     cluster headaches  . Arthritis     Medications:  Scheduled:  . buPROPion  100 mg Oral BID  . dexamethasone  4 mg Intravenous 4 times per day    Or  . dexamethasone  4 mg Oral 4 times per day  . diazepam      . docusate sodium  100 mg Oral BID  . fentaNYL      . gabapentin  800 mg Oral BID  . HYDROcodone-acetaminophen      . insulin aspart  0-20 Units Subcutaneous 6 times per day  . metFORMIN  1,000 mg Oral BID WC  . traZODone  100 mg Oral QHS   Infusions:  . lactated ringers 20 mL/hr at 08/12/14 0726  . lactated ringers     Assessment: 58 yo male s/p neurosurgery will be put on vancomycin for surgical prophylaxis.  Patient received vancomycin 1g iv x1 at 0923 on 08/12/14.  SCr on 08/05/14 was 0.92 (CrCl ~ 92.8). Per Rn, patient does have a drain.  Goal of Therapy:  Vancomycin trough level 10-15 mcg/ml  Plan:  - vancomycin 1g iv q12h, next dose at 2100 tonight - f/u plan on antibiotic before checking vancomycin trough  Jac Romulus, Tsz-Yin 08/12/2014,3:01 PM

## 2014-08-12 NOTE — Progress Notes (Signed)
Subjective:  The patient is somnolent but easily arousable. He is in no apparent stress. He looks well.  Objective: Vital signs in last 24 hours: Temp:  [98.5 F (36.9 C)] 98.5 F (36.9 C) (10/05 0707) Pulse Rate:  [83] 83 (10/05 0707) Resp:  [18] 18 (10/05 0707) BP: (157)/(85) 157/85 mmHg (10/05 0707) SpO2:  [99 %] 99 % (10/05 0707) Weight:  [125.646 kg (277 lb)] 125.646 kg (277 lb) (10/05 0707)  Intake/Output from previous day:   Intake/Output this shift: Total I/O In: 1600 [I.V.:1600] Out: -   Physical exam the patient's dressing is clean and dry. There is no hematoma or shift. He is moving all 4 extremities well including his deltoids.  Lab Results: No results found for this basename: WBC, HGB, HCT, PLT,  in the last 72 hours BMET No results found for this basename: NA, K, CL, CO2, GLUCOSE, BUN, CREATININE, CALCIUM,  in the last 72 hours  Studies/Results: No results found.  Assessment/Plan: The patient is doing well.  LOS: 0 days     Stephenia Vogan D 08/12/2014, 1:14 PM

## 2014-08-12 NOTE — Anesthesia Postprocedure Evaluation (Signed)
  Anesthesia Post-op Note  Patient: Ronald Atkinson  Procedure(s) Performed: Procedure(s): Cervical four/five anterior cervical decompression with fusion interbody prosthesis plating and bonegraft with removal of old plate (N/A)  Patient Location: PACU  Anesthesia Type:General  Level of Consciousness: awake, alert  and oriented  Airway and Oxygen Therapy: Patient Spontanous Breathing and Patient connected to nasal cannula oxygen  Post-op Pain: mild  Post-op Assessment: Post-op Vital signs reviewed, Patient's Cardiovascular Status Stable, Respiratory Function Stable and Patent Airway  Post-op Vital Signs: Reviewed and stable  Last Vitals:  Filed Vitals:   08/12/14 1315  BP:   Pulse: 90  Temp: 37.1 C  Resp:     Complications: No apparent anesthesia complications

## 2014-08-12 NOTE — Op Note (Signed)
Brief history: The patient is a 58 year old black male on whom I previously performed a C5-6 and C6-7 anterior cervical discectomy, fusion, and plating. The patient has developed recurrent neck and shoulder pain consistent with a cervical radiculopathy/myelopathy. He has failed medical management and was worked up with a cervical MRI. This demonstrated significant stenosis at C4-5. I discussed the various treatment options with the patient including surgery. He has weighed the risks, benefits, and alternatives surgery and decided proceed with a C4-5 anterior cervical discectomy, fusion, and plating with removal results plate and screws from C5-C7.  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 Delma OfficerJeff Aiyannah Fayad  Asst.: Dr. Jillyn HiddenGary cram  Anesthesia: Gen. endotracheal  Estimated blood loss: 100 cc  Drains: One 10 mm flat Jackson-Pratt drain in the prevertebral space  Complications: None  Description of procedure: The patient was brought to the operating room by the anesthesia team. General endotracheal anesthesia was induced. A roll was placed under the patient's shoulders to keep the neck in the neutral position. The patient's anterior cervical region was then prepared with Betadine scrub and Betadine solution. Sterile drapes were applied.  The area to be incised was then injected with Marcaine with epinephrine solution. I then used a scalpel to make a transverse incision in the patient's left  anterior neck. I used the Metzenbaum scissors to divide the platysmal muscle and then to dissect through the scar tissue, medial to the sternocleidomastoid muscle, jugular vein, and carotid artery. I carefully dissected down towards the anterior cervical spine identifying the esophagus and retracting it medially. Then using Kitner swabs to clear soft tissue from the old anterior cervical plate and spine. I explored the fusion by unlocking the cams and the old plate and removing the screws and then removing the plate. Of note the right C7 screw was fractured and we therefore only removed the head. The arthrodesis appears solid.  I then used electrocautery to detach the medial border of the longus colli muscle bilaterally from the C4-5 intervertebral disc spaces. I then inserted the Caspar self-retaining retractor underneath the longus colli muscle bilaterally to provide exposure.  We then incised the intervertebral disc at C4-5, it was quite spondylotic and collapsed. We then performed a partial intervertebral discectomy with a pituitary forceps and the Karlin curettes. I then inserted distraction screws into the vertebral bodies at C4 and C5. We brought the operative microscope into the field and under its medication and illumination we completed the decompression. We then distracted the interspace. We then used the high-speed drill to decorticate the vertebral endplates at C4-5, to drill away the remainder of the intervertebral disc, to drill away some posterior spondylosis, and to thin out the posterior longitudinal ligament. I then incised ligament with the arachnoid knife. We then removed the ligament with a Kerrison punches undercutting the vertebral endplates and decompressing the thecal sac. We then performed foraminotomies about the bilateral C5 nerve roots. This completed the decompression at this level.  We now turned our to attention to the interbody fusion. We used the trial spacers to determine  the appropriate size for the interbody prosthesis. We then pre-filled prosthesis with a combination  of local morcellized autograft bone that we obtained during decompression as well as Actifuse bone graft extender. We then inserted the prosthesis into the distracted interspace at C4-5. We then removed the distraction screws. There was a good snug fit of the prosthesis in the interspace.  Having completed the fusion we now turned attention to the anterior spinal instrumentation. We used the high-speed drill to drill away some anterior spondylosis at the disc spaces so that the plate lay down flat. We selected the appropriate length titanium anterior cervical plate. We laid it along the anterior aspect of the vertebral bodies from C4-C5. We then drilled 14 mm holes at C4 and on the right at C5. We used the old screw hole on the left at C5. We then secured the plate to the vertebral bodies by placing two 14 mm self-tapping screws at C4 and C5. We then obtained intraoperative radiograph. There was limited visualization of the instrumentation because of the patient's shoulders. The construct looked good in vivo. We therefore secured the screws the plate the locking each cam. This completed the instrumentation.  We then obtained hemostasis using bipolar electrocautery. We irrigated the wound out with bacitracin solution. We then removed the retractor. We inspected the esophagus for any damage. There was none apparent. I placed a 10 mm flat Jackson-Pratt drain in the prevertebral space. I tunneled it out through a separate stab wound. We then reapproximated patient's platysmal muscle with interrupted 3-0 Vicryl suture. We then reapproximated the subcutaneous tissue with interrupted 3-0 Vicryl suture. The skin was reapproximated with Steri-Strips and benzoin. The wound was then covered with bacitracin ointment. A sterile dressing was applied. The drapes were removed. Patient was subsequently extubated by the anesthesia  team and transported to the post anesthesia care unit in stable condition. All sponge instrument and needle counts were reportedly correct at the end of this case.

## 2014-08-13 DIAGNOSIS — M5002 Cervical disc disorder with myelopathy, mid-cervical region: Secondary | ICD-10-CM | POA: Diagnosis not present

## 2014-08-13 LAB — GLUCOSE, CAPILLARY
Glucose-Capillary: 189 mg/dL — ABNORMAL HIGH (ref 70–99)
Glucose-Capillary: 228 mg/dL — ABNORMAL HIGH (ref 70–99)

## 2014-08-13 MED ORDER — DIAZEPAM 5 MG PO TABS: 5.0000 mg | ORAL_TABLET | Freq: Four times a day (QID) | ORAL | Status: DC | PRN

## 2014-08-13 MED ORDER — OXYCODONE-ACETAMINOPHEN 10-325 MG PO TABS: 1.0000 | ORAL_TABLET | ORAL | Status: DC | PRN

## 2014-08-13 MED ORDER — DSS 100 MG PO CAPS: 100.0000 mg | ORAL_CAPSULE | Freq: Two times a day (BID) | ORAL | Status: DC

## 2014-08-13 NOTE — Discharge Summary (Signed)
Physician Discharge Summary  Patient ID: Ronald Atkinson MRN: [id number] DOB/AGE: 14-Nov-1955 58 y.o.  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 The surgery went well.  The patient's postoperative course was unremarkable. On postoperative day #1 the patient requested discharge to home. He was given oral and written discharge instructions. All his questions were answered.  Consults: None Significant Diagnostic Studies: None Treatments: C4-5 anterior cervical discectomy, fusion, plating, exploration of cervical fusion/removal of old cervical plate Discharge Exam: Blood pressure 145/81, pulse 88, temperature 98.2 F (36.8 C), temperature source Oral, resp. rate 17, weight 125.646 kg (277 lb), SpO2 97.00%. The patient is alert and pleasant. He looks well. His dressing is clean and dry. There is no evidence of hematoma or shift. I removed the drain. His strength is normal his bilateral deltoid , biceps, lower extremities and  Disposition: Home  Discharge Instructions   Call MD for:  difficulty breathing, headache or visual disturbances    Complete by:  As directed      Call MD for:  extreme fatigue    Complete by:  As directed      Call MD for:  hives    Complete by:  As directed      Call MD for:  persistant dizziness or light-headedness    Complete by:  As directed      Call MD for:  persistant nausea and vomiting    Complete by:  As directed      Call MD for:  redness, tenderness, or signs of infection (pain, swelling, redness, odor  or green/yellow discharge around incision site)    Complete by:  As directed      Call MD for:  severe uncontrolled pain    Complete by:  As directed      Call MD for:  temperature >100.4    Complete by:  As directed      Diet - low sodium heart healthy    Complete by:  As directed      Discharge instructions    Complete by:  As directed   Call [phone] for a followup appointment. Take a stool softener while you are using pain medications.     Driving Restrictions    Complete by:  As directed   Do not drive for 2 weeks.     Increase activity slowly    Complete by:  As directed      Lifting restrictions    Complete by:  As directed   Do not lift more than 5 pounds. No excessive bending or twisting.     May shower / Bathe    Complete by:  As directed   He may shower after the pain she is removed 3 days after surgery. Leave the incision alone.     Remove dressing in 48 hours    Complete by:  As directed   Your stitches are under the scan and will dissolve by themselves. The Steri-Strips will fall off after you take a few showers. Do not rub back or pick at the wound, Leave the wound alone.            Medication List  STOP taking these medications       cyclobenzaprine 10 MG tablet  Commonly known as:  FLEXERIL     meloxicam 15 MG tablet  Commonly known as:  MOBIC      TAKE these medications       bisacodyl 5 MG EC tablet  Commonly known as:  DULCOLAX  Take 5 mg by mouth 2 (two) times daily.     buPROPion 100 MG tablet  Commonly known as:  WELLBUTRIN  Take 100 mg by mouth 2 (two) times daily.     diazepam 5 MG tablet  Commonly known as:  VALIUM  Take 1 tablet (5 mg total) by mouth every 6 (six) hours as needed for muscle spasms.     DSS 100 MG Caps  Take 100 mg by mouth 2 (two) times daily.     gabapentin 800 MG tablet  Commonly known as:  NEURONTIN  Take 800 mg by mouth 2 (two) times daily.     insulin aspart 100 UNIT/ML injection  Commonly known as:   novoLOG  Inject 0-10 Units into the skin 3 (three) times daily before meals. SLIDING SCALE     insulin glargine 100 UNIT/ML injection  Commonly known as:  LANTUS  Inject 50 Units into the skin at bedtime.     metFORMIN 1000 MG tablet  Commonly known as:  GLUCOPHAGE  Take 1,000 mg by mouth 2 (two) times daily with a meal.     oxyCODONE-acetaminophen 10-325 MG per tablet  Commonly known as:  PERCOCET  Take 1 tablet by mouth every 4 (four) hours as needed for pain.     traZODone 100 MG tablet  Commonly known as:  DESYREL  Take 100 mg by mouth at bedtime.         SignedCristi Loron: Delayne Sanzo D 08/13/2014, 6:55 AM

## 2014-08-13 NOTE — Progress Notes (Signed)
Pt doing well. Pt and wife given D/C instructions with Rx's, verbal understanding was provided. Pt's IV and JP drain was removed prior to D/C. Pt D/C'd home via wheelchair @ 1025 per MD order. Pt is stable @ D/C and has no other needs at this time. Rema FendtAshley Myracle Febres, RN

## 2014-08-13 NOTE — Discharge Instructions (Signed)
Call MD for: Difficulty breathing, headache or visual disturbances, extreme fatigue  °Call MD for: hives ° Call MD for: persistant dizziness or light-headedness ° Call MD for: persistant nausea and vomiting  °Call MD for: redness, tenderness, or signs of infection (pain, swelling, redness, odor or green/yellow discharge around incision site)  °Call MD for: severe uncontrolled pain  °Call MD for: temperature >100.4 Diet - low sodium heart healthy Discharge instructions  ° Call [phone] for a followup appointment. °  °Increase activity slowly Remove dressing in 48 hours  ° ° ° °Anterior Cervical Diskectomy and Fusion °Anterior cervical diskectomy is surgery done on the upper spine to relieve pressure on one or more nerve roots, or on the spinal cord. There are 7 bones in your neck, called the cervical spine. These 7 bones (vertebrae) sit one on top of the other. Cushions (intervertebral disks) separate the vertebrae and act like shock absorbers. As we age, degeneration of our bones, joints, and disks can cause neck pain and tightening around the spinal cord and nerve roots. This causes arm pain and weakness.  °Degeneration involves: °· Herniated Disk. With age, the disks dry up and can rupture. In this condition, the center of the disk bulges out (disk herniation). This can cause pressure on a nerve, which produces pain or weakness in the arm. °· Bone spurs and spinal stenosis. As we age, growths often develop on our bones. These growths are called bone spurs (osteophytes). A bone spur is a collection of calcium. As bone spurs grow and extend, the vertebral openings become narrow. The spinal canal and/or the foramen (opening for nerve passageways) become smaller. This narrowing (stenosis) may cause pinching (compression) of the spinal cord or the spinal nerve root. The nerve injury can cause pain, weakness, numbness, and loss of coordination in the upper limbs. Often, patients have difficulty with their hand  writing or they start dropping things, because their hand grip is weaker. The spinal cord damage can cause increased stiffness, more frequent falls, electric shooting pain, and changes in bowel and bladder control. °Degeneration in the neck results in three common problems: °· Radiculopathy - Nerve compression that results in weakness or pain that radiates down the arm. °· Myelopathy - Spinal cord compression that causes stiffness, difficulty with walking, coordination, and trouble with bowel or bladder habits. °· Neck pain - Worn out joints cause pain as the neck moves. °Treatment: °· Radiculopathy - Surgery is performed to remove the bony and disk material that is pushing on the nerve. °· Myelopathy - Surgery is performed to remove the bony and disk material that pushes on the spinal cord. °· Neck pain - Surgery is performed to combine (fuse) the joints of the neck together, so they cannot move or cause pain. °Surgery can be done from the front or the back of the neck. When it is done from the front, it is called an anterior (front) cervical (neck) diskectomy (removal of the disk) and fusion. °LET YOUR CAREGIVER KNOW ABOUT:  °· Recent infections. °· Any shooting pains down your leg, when you move your neck. °· Any difficulty swallowing. °· A smoking history. °· Use of blood thinners or anti-inflammatory medicines. °· Any history of injury to your shoulders. °· Any history of injury to your vocal cords. °· Any foreign objects in your body from a previous surgery. °· Any recent fevers or illness. °· Past medical history (diabetes, strokes). °· Past problems with anesthetics. °· Possibility of pregnancy. °· History of blood clots (  clots (deep vein thrombosis).  History of bleeding or blood problems.  Past surgeries.  Other health problems.  Allergies.  Medicines you take, including herbs, eye drops, over-the-counter medicines, and creams.  Use of steroids (by mouth or creams). RISKS AND  COMPLICATIONS  Infection.  Bleeding.  Injury to the following structures:  Carotid artery. This can result in a stroke or significant amount of bleeding.  Esophagus, resulting in difficulty swallowing.  Recurrent laryngeal nerve, resulting in hoarseness of the voice.  Spinal cord injury, ranging from mild to complete quadriparesis (muscle weakness in all four limbs).  Nerve root injury, resulting in muscle weakness in the upper limb.  Leakage of cerebrospinal fluid. BEFORE THE PROCEDURE   You will be given medicine to help you sleep (general anesthetic), and a breathing tube will be placed.  You will be given antibiotics to keep the infection rate down.  The incision site on your neck will be marked.  Your neck will be cleaned, to reduce the risk of infection. PROCEDURE  An anterior cervical fusion means that the operation is done through the front (anterior) part of your neck. The cut made by the surgeon (incision) is usually within a skin fold line on the neck. After pushing aside the neck muscles, the surgeon removes the affected, degenerated disk and bone spurs (osteophytes), which takes the pressure off the nerves and spinal cord. This is called a decompression. The area where the disk was removed is then filled with a small piece of plastic. This plastic takes the place of the disk and keeps the nerve passageway (foramen) open and clear for the nerves. In most cases, the surgeon uses metal plates or pins (hardware) in the neck, to help stabilize the level being fused. The hardware reduces motion at that level, so it can fuse. This provides extra support to the neck. A cervical fusion procedure takes anywhere from a couple to several hours, depending on the size of the neck, history of previous surgery, and number of levels being fused. AFTER THE PROCEDURE   You will likely spend 24-48 hours in the hospital. During this time, your caregivers will look for any signs of  complications from the procedure.  Your caregiver will watch you, to make sure that fluid draining from the surgery slows down. It is important that a large mass of blood does not form in your neck, which would cause difficulty with breathing.  You will get 24 hours of antibiotics.  You can start to eat as soon as you feel comfortable.  Once you have started eating, walking, urinating (voiding) and having bowel movements on your own, your caregiver will discharge you home. HOME CARE INSTRUCTIONS   For 2 weeks, do not soak the incision site under water. Do not swim or take baths. Showers are okay, but rinse off the incision sites.  Do not over exert yourself. Allow time for the incision to heal.  It can take from 6 weeks to 6 months for fusion to take effect. Your caregiver may ask you to wear a neck collar during this time, as they check the fusion with multiple (serial) X-rays. Document Released: 10/13/2009 Document Revised: 02/19/2013 Document Reviewed: 10/13/2009 [facility] Patient Information 2015 [place], [place]. This information is not intended to replace advice given to you by your health care provider. Make sure you discuss any questions you have with your health care provider.      Cervical Collar A cervical collar is used to hold the head and neck  still. This may be a soft, thick collar or a more rigid hard collar. This can keep your sore neck muscles from hurting. The collar also protects you in case a more serious injury of the neck is present and can not be seen yet on an x-ray. FOLLOW THESE INSTRUCTIONS FOR COLLAR USE:  Attach the Velcro tab in back.  Make it tight enough to support part of the weight of your chin.  Do not make it so tight that it hurts or makes it hard to breathe.  Wear it until you are comfortable without it or as instructed. HOME CARE INSTRUCTIONS   Ice packs to the neck or areas of pain approximately 03-04 times a day for 15-20 minutes while awake.  Do this for 2 days. Ask your physician if you may remove the cervical collar for bathing or ice.  Do not remove any collar unless instructed by a caregiver. Ask if the collar may be removed for showering or eating.  Only take over-the-counter or prescription medicines for pain, discomfort, or fever as directed by your caregiver.  Do not drive a car until given permission by your caregiver.  Follow all instructions for follow-up with your caregiver. This includes any referrals, physical therapy, and rehabilitation. Any delay in obtaining necessary care could result in a delay or failure of the injury to heal properly. SEEK IMMEDIATE MEDICAL CARE IF:   You develop increasing pain.  You develop problems using your arms or legs or have tingling or other funny feelings in them.  You develop loss of strength in either of your arms or legs or have difficulty walking.  You develop a fever or shaking chills.  You lose control of your bowels or bladder. MAKE SURE YOU:   Understand these instructions.  Will watch your condition.  Will get help right away if you are not doing well or get worse. Document Released: 07/17/2004 Document Revised: 01/17/2012 Document Reviewed: 06/17/2008 [facility] Patient Information 2015 [place], [place]. This information is not intended to replace advice given to you by your health care provider. Make sure you discuss any questions you have with your health care provider.

## 2014-08-15 ENCOUNTER — Encounter (HOSPITAL_COMMUNITY): Payer: Self-pay | Admitting: Neurosurgery

## 2017-09-26 ENCOUNTER — Other Ambulatory Visit: Payer: Self-pay

## 2017-09-26 ENCOUNTER — Encounter (HOSPITAL_COMMUNITY): Payer: Self-pay

## 2017-09-26 ENCOUNTER — Encounter (HOSPITAL_COMMUNITY)
Admission: RE | Admit: 2017-09-26 | Discharge: 2017-09-26 | Disposition: A | Discharge status: Home / Self Care | Payer: Non-veteran care | Source: Ambulatory Visit | Attending: Orthopedic Surgery | Admitting: Orthopedic Surgery

## 2017-09-26 DIAGNOSIS — E119 Type 2 diabetes mellitus without complications: Secondary | ICD-10-CM | POA: Insufficient documentation

## 2017-09-26 DIAGNOSIS — G4733 Obstructive sleep apnea (adult) (pediatric): Secondary | ICD-10-CM | POA: Diagnosis not present

## 2017-09-26 DIAGNOSIS — Z794 Long term (current) use of insulin: Secondary | ICD-10-CM | POA: Diagnosis not present

## 2017-09-26 DIAGNOSIS — Z79899 Other long term (current) drug therapy: Secondary | ICD-10-CM | POA: Insufficient documentation

## 2017-09-26 DIAGNOSIS — Z981 Arthrodesis status: Secondary | ICD-10-CM | POA: Diagnosis not present

## 2017-09-26 DIAGNOSIS — Z01812 Encounter for preprocedural laboratory examination: Secondary | ICD-10-CM | POA: Diagnosis not present

## 2017-09-26 DIAGNOSIS — I10 Essential (primary) hypertension: Secondary | ICD-10-CM | POA: Diagnosis not present

## 2017-09-26 DIAGNOSIS — Z01818 Encounter for other preprocedural examination: Secondary | ICD-10-CM | POA: Diagnosis not present

## 2017-09-26 HISTORY — DX: Essential (primary) hypertension: I10

## 2017-09-26 HISTORY — DX: Sleep apnea, unspecified: G47.30

## 2017-09-26 HISTORY — DX: Anxiety disorder, unspecified: F41.9

## 2017-09-26 LAB — BASIC METABOLIC PANEL
Anion gap: 6 (ref 5–15)
BUN: 21 mg/dL — AB (ref 6–20)
CO2: 24 mmol/L (ref 22–32)
Calcium: 9.1 mg/dL (ref 8.9–10.3)
Chloride: 107 mmol/L (ref 101–111)
Creatinine, Ser: 1.34 mg/dL — ABNORMAL HIGH (ref 0.61–1.24)
GFR calc Af Amer: >60 mL/min (ref >60)
GFR calc non Af Amer: 56 mL/min — ABNORMAL LOW (ref >60)
GLUCOSE: 109 mg/dL — AB (ref 65–99)
Potassium: 4.1 mmol/L (ref 3.5–5.1)
SODIUM: 137 mmol/L (ref 135–145)

## 2017-09-26 LAB — SURGICAL PCR SCREEN
MRSA, PCR: NEGATIVE
Staphylococcus aureus: NEGATIVE

## 2017-09-26 LAB — CBC
HCT: 37.8 % — ABNORMAL LOW (ref 39.0–52.0)
Hemoglobin: 12.6 g/dL — ABNORMAL LOW (ref 13.0–17.0)
MCH: 29.7 pg (ref 26.0–34.0)
MCHC: 33.3 g/dL (ref 30.0–36.0)
MCV: 89.2 fL (ref 78.0–100.0)
PLATELETS: 277 10*3/uL (ref 150–400)
RBC: 4.24 MIL/uL (ref 4.22–5.81)
RDW: 15.9 % — AB (ref 11.5–15.5)
WBC: 7.4 10*3/uL (ref 4.0–10.5)

## 2017-09-26 LAB — HEMOGLOBIN A1C
Hgb A1c MFr Bld: 9.9 % — ABNORMAL HIGH (ref 4.8–5.6)
Mean Plasma Glucose: 237.43 mg/dL

## 2017-09-26 LAB — GLUCOSE, CAPILLARY: Glucose-Capillary: 153 mg/dL — ABNORMAL HIGH (ref 65–99)

## 2017-09-26 NOTE — Pre-Procedure Instructions (Signed)
Ronald Atkinson  09/26/2017     No Pharmacies Listed   Your procedure is scheduled on November 30th, Friday.   [facility] at 10:30AM.             (posted surgery time 12:30p - 2:45p)   Call this number if you have problems the MORNING of surgery:  [phone]   Remember:              4-5 days prior to surgery, STOP TAKING any Vitamins, Herbal Supplements, Anti-inflammtories.   Do not eat food or drink liquids after midnight Thursday.   Take these medicines the morning of surgery with A SIP OF WATER : Wellbutrin, Valium, Oxycodone.   Do not wear jewelry - no rings or watches.  Do not wear lotions, colognes or deoderant.             Men may shave face and neck.   Do not bring valuables to the hospital.  [facility] is [facility] any belongings or valuables.  Contacts, dentures or bridgework may not be worn into surgery.  Leave your suitcase in the car.  After surgery it may be brought to your room.  For patients admitted to the hospital, discharge time will be determined by your treatment team.  Please read over the following fact sheets that you were given. Pain Booklet, MRSA Information and Surgical Site Infection Prevention        How to Manage Your Diabetes Before and After Surgery  Why is it important to control my blood sugar before and after surgery? . Improving blood sugar levels before and after surgery helps healing and can limit problems. . A way of improving blood sugar control is eating a healthy diet by: .  o  Eating less sugar and carbohydrates o  Increasing activity/exercise o  Talking with your doctor about reaching your blood sugar goals o  . High blood sugars (greater than 180 mg/dL) can raise your risk of infections and slow your recovery, so you will need to focus on controlling your diabetes during the weeks before surgery. . Make sure that the doctor who takes care of your diabetes knows about your  planned surgery including the date and location.  How do I manage my blood sugar before surgery? . Check your blood sugar at least 4 times a day, starting 2 days before surgery, to make sure that the level is not too high or low. Marland Kitchen.  o Check your blood sugar the morning of your surgery when you wake up and every 2 hours until you get to the Short Stay unit. . If your blood sugar is less than 70 mg/dL, you will need to treat for low blood sugar: o Do not take insulin. o Treat a low blood sugar (less than 70 mg/dL) with  cup of clear juice (cranberry or apple), 4 glucose tablets, OR glucose gel. o  Recheck blood sugar in 15 minutes after treatment (to make sure it is greater than 70 mg/dL). If your blood sugar is not greater than 70 mg/dL on recheck, call [phone] o  for further instructions. . Report your blood sugar to the short stay nurse when you get to Short Stay.  . If you are admitted to the hospital after surgery: o Your blood sugar will be checked by the staff and you will probably be given insulin after surgery (instead of oral diabetes medicines) to make sure you have good  blood sugar levels. o The goal for blood sugar control after surgery is 80-180 mg/dL.   WHAT DO I DO ABOUT MY DIABETES MEDICATION?   Marland Kitchen. Do not take oral diabetes medicines (pills) the morning of surgery.  . THE NIGHT BEFORE SURGERY, take 49 units of Novolin 70/30 insulin.      . THE MORNING OF SURGERY, DO NOT  take  units of  insulin.  . The day of surgery, do not take other diabetes injectables, including Byetta (exenatide), Bydureon (exenatide ER), Victoza (liraglutide), or Trulicity (dulaglutide).  . If your CBG is greater than 220 mg/dL, you may take  of your sliding scale (correction) dose of insulin.  Other Instructions:          Patient Signature:  Date:   Nurse Signature:  Date:   Reviewed and Endorsed by [facility] Patient Education Committee, August 2015

## 2017-09-26 NOTE — Progress Notes (Addendum)
PCP [place] issues, tho he did have stress test there in 2017. (requested those records) Denies murmur, sob, cp. Tries to check his sugars BID.  Usually run 98-190.  He thinks his last A1C was 10.7 in Sept. 2018.

## 2017-09-28 NOTE — Progress Notes (Signed)
Anesthesia Chart Review:  Pt is a 61 year old male scheduled for R total knee arthroplasty on 10/07/2017 with Beverely LowSteve Norris, M.D.  - [facility]. Pt cleare86d for surgery by Dr. Jana HakimSandhya Gandhi.   PMH includes: HTN (not on meds), DM, OSA. Never smoker. BMI 44. S/p ACDF 08/12/14.   Medications include: Novolin 70/30/30, latuda, metformin, sildenafil  BP (!) 154/84   Pulse 85   Temp 36.8 C   Resp 20   Ht 5\' 10"  (1.778 m)   Wt (!) 309 lb (140.2 kg)   SpO2 96%   BMI 44.34 kg/m   Preoperative labs reviewed.   - HbA1c 9.9, glucose 109  [place]. Will get EKG day of surgery.   Exercise stress test 06/18/16 Kathryne Sharper([facility]): - Negative study for ischemia by EKG criteria.  I left voicemail for Jacki ConesLaurie in Dr. Ranell PatrickNorris' office about pt's uncontrolled DM.   If EKG and blood sugar are acceptable day of surgery, I anticipate pt can proceed as scheduled.   Rica Mastngela Kabbe, FNP-BC [facility]esthesiology Phone: [phone] 09/28/2017 4:33 PM

## 2017-09-28 NOTE — H&P (Signed)
Ronald Atkinson is an 61 y.o. male.    Chief Complaint: right knee pain  HPI: Pt is a 61 y.o. male complaining of right knee pain for multiple years. Pain had continually increased since the beginning. X-rays in the clinic show end-stage arthritic changes of the right knee. Pt has tried various conservative treatments which have failed to alleviate their symptoms, including injections and therapy. Various options are discussed with the patient. Risks, benefits and expectations were discussed with the patient. Patient understand the risks, benefits and expectations and wishes to proceed with surgery.   PCP:  System, Provider Not In  D/C Plans: Home  PMH: Past Medical History:  Diagnosis Date  . Anxiety   . Arthritis   . Depression   . Diabetes mellitus without complication ([place])    takes pills and insulin  -  dx 2008  . Headache(784.0)    cluster headaches  . Hypertension   . Sleep apnea    tested 2015    PSH: Past Surgical History:  Procedure Laterality Date  . ANTERIOR CERVICAL DECOMP/DISCECTOMY FUSION N/A 08/12/2014   Procedure: Cervical four/five anterior cervical decompression with fusion interbody prosthesis plating and bonegraft with removal of old plate;  Surgeon: Newman Pies, MD;  Location: [facility];  Service: Neurosurgery;  Laterality: N/A;  . CARPAL TUNNEL RELEASE     both wrists  . EYE SURGERY     bil cataracts  . JOINT REPLACEMENT     right shoulder  . ULNAR NERVE REPAIR     right shoulder and neck    Social History:  reports that  has never smoked. he has never used smokeless tobacco. He reports that he does not drink alcohol or use drugs.  Allergies:  Allergies  Allergen Reactions  . Bee Venom Anaphylaxis and Hives  . Penicillins Shortness Of Breath, Rash and Other (See Comments)    Has patient had a PCN reaction causing immediate rash, facial/tongue/throat swelling, SOB or lightheadedness with hypotension: Yes Has patient had a PCN reaction  causing severe rash involving mucus membranes or skin necrosis: No Has patient had a PCN reaction that required hospitalization: Yes Has patient had a PCN reaction occurring within the last 10 years: Yes  of the above answers are "NO", then may proceed with Cephalosporin use.     Medications: No current facility-administered medications for this encounter.    Current Outpatient Medications  Medication Sig Dispense Refill  . bisacodyl (DULCOLAX) 5 MG EC tablet Take 5 mg by mouth 2 (two) times daily.    Marland Kitchen buPROPion (WELLBUTRIN) 100 MG tablet Take 100-200 mg 2 (two) times daily by mouth. Takes 2 tablets in the morning and 1 tablet in the afternoon    . calcium carbonate (TUMS - DOSED IN MG ELEMENTAL CALCIUM) 500 MG chewable tablet Chew 1-2 tablets 2 (two) times daily by mouth. Depends on stomach pain if takes 1-2 tablets    . cyclobenzaprine (FLEXERIL) 10 MG tablet Take 10 mg at bedtime by mouth.    . diazepam (VALIUM) 5 MG tablet Take 1 tablet (5 mg total) by mouth every 6 (six) hours as needed for muscle spasms. (Patient taking differently: Take 5 mg every 6 (six) hours by mouth. ) 50 tablet 1  . fluticasone (FLONASE) 50 MCG/ACT nasal spray Place 1 spray 2 (two) times daily as needed into both nostrils for allergies or rhinitis.    Marland Kitchen gabapentin (NEURONTIN) 800 MG tablet Take 800 mg by mouth 2 (two) times daily.    Marland Kitchen  Insulin NPH Isophane & Regular (NOVOLIN 70/30 FLEXPEN [place]) Inject 60 Units 2 (two) times daily before a meal into the skin.    Marland Kitchen loratadine (CLARITIN) 10 MG tablet Take 10 mg daily as needed by mouth for allergies.    Marland Kitchen lurasidone (LATUDA) 40 MG TABS tablet Take 40 mg every evening by mouth.    . Magnesium Oxide 420 MG TABS Take 420 mg 2 (two) times daily by mouth.    . metFORMIN (GLUCOPHAGE) 1000 MG tablet Take 1,000 mg by mouth 2 (two) times daily with a meal.    . oxyCODONE-acetaminophen (PERCOCET) 10-325 MG per tablet Take 1 tablet by mouth every 4 (four) hours as needed for  pain. (Patient taking differently: Take 1 tablet daily as needed by mouth for pain. ) 100 tablet 0  . sildenafil (VIAGRA) 100 MG tablet Take 50 mg daily as needed by mouth for erectile dysfunction.    Marland Kitchen Specialty Vitamins Products (LONGEVITY) CAPS Take 1 each daily by mouth. Healthy Body Star pack 2.0    . traZODone (DESYREL) 100 MG tablet Take 100 mg by mouth at bedtime.    Marland Kitchen venlafaxine XR (EFFEXOR-XR) 150 MG 24 hr capsule Take 150 mg daily with breakfast by mouth.    . docusate sodium 100 MG CAPS Take 100 mg by mouth 2 (two) times daily. (Patient not taking: Reported on 09/22/2017) 60 capsule 0    Results for orders placed or performed during the hospital encounter of 09/26/17 (from the past 48 hour(s))  Surgical pcr screen     Status: None   Collection Time: 09/26/17  2:18 PM  Result Value Ref Range   MRSA, PCR NEGATIVE NEGATIVE   Staphylococcus aureus NEGATIVE NEGATIVE    Comment: (NOTE) The Xpert SA Assay (FDA approved for NASAL specimens in patients 69 years of age and older), is one component of a comprehensive surveillance program. It is not intended to diagnose infection nor to guide or monitor treatment.   CBC     Status: Abnormal   Collection Time: 09/26/17  2:30 PM  Result Value Ref Range   WBC 7.4 4.0 - 10.5 K/uL   RBC 4.24 4.22 - 5.81 MIL/uL   Hemoglobin 12.6 (L) 13.0 - 17.0 g/dL   HCT 37.8 (L) 39.0 - 52.0 %   MCV 89.2 78.0 - 100.0 fL   MCH 29.7 26.0 - 34.0 pg   MCHC 33.3 30.0 - 36.0 g/dL   RDW 15.9 (H) 11.5 - 15.5 %   Platelets 277 150 - 400 K/uL  Basic metabolic panel     Status: Abnormal   Collection Time: 09/26/17  2:30 PM  Result Value Ref Range   Sodium 137 135 - 145 mmol/L   Potassium 4.1 3.5 - 5.1 mmol/L   Chloride 107 101 - 111 mmol/L   CO2 24 22 - 32 mmol/L   Glucose, Bld 109 (H) 65 - 99 mg/dL   BUN 21 (H) 6 - 20 mg/dL   Creatinine, Ser 1.34 (H) 0.61 - 1.24 mg/dL   Calcium 9.1 8.9 - 10.3 mg/dL   GFR calc non Af Amer 56 (L) >60 mL/min   GFR calc Af  Amer >60 >60 mL/min    Comment: (NOTE) The eGFR has been calculated using the CKD EPI equation. This calculation has not been validated in all clinical situations. eGFR's persistently <60 mL/min signify possible Chronic Kidney Disease.    Anion gap 6 5 - 15  Hemoglobin A1c     Status: Abnormal  Collection Time: 09/26/17  2:30 PM  Result Value Ref Range   Hgb A1c MFr Bld 9.9 (H) 4.8 - 5.6 %    Comment: (NOTE) Pre diabetes:          5.7%-6.4% Diabetes:              >6.4% Glycemic control for   <7.0% adults with diabetes    Mean Plasma Glucose 237.43 mg/dL   No results found.  ROS: Pain with rom of the right lower extremity  Physical Exam:  Alert and oriented 61 y.o. male in no acute distress Cranial nerves 2-12 intact Cervical spine: full rom with no tenderness, nv intact distally Chest: active breath sounds bilaterally, no wheeze rhonchi or rales Heart: regular rate and rhythm, no murmur Abd: non tender non distended with active bowel sounds Hip is stable with rom  Right knee moderate crepitus with rom Pain medial and lateral joint line nv intact distally No rashes or edema Antalgic gait  Assessment/Plan Assessment: right knee end stage osteoarthritis  Plan: Patient will undergo a right total knee by Dr. Veverly Fells at [facility]. Risks benefits and expectations were discussed with the patient. Patient understand risks, benefits and expectations and wishes to proceed.

## 2017-10-06 MED ORDER — CLINDAMYCIN PHOSPHATE 900 MG/50ML IV SOLN
900.0000 mg | INTRAVENOUS | Status: AC
  Administered 2017-10-07: 900 mg via INTRAVENOUS
  Filled 2017-10-06: qty 50

## 2017-10-06 MED ORDER — TRANEXAMIC ACID 1000 MG/10ML IV SOLN
1000.0000 mg | INTRAVENOUS | Status: DC
  Filled 2017-10-06: qty 10

## 2017-10-07 ENCOUNTER — Encounter (HOSPITAL_COMMUNITY)
Admission: RE | Disposition: A | Discharge status: Home Health | Payer: Self-pay | Source: Ambulatory Visit | Attending: Orthopedic Surgery

## 2017-10-07 ENCOUNTER — Inpatient Hospital Stay (HOSPITAL_COMMUNITY): Payer: Non-veteran care | Admitting: Anesthesiology

## 2017-10-07 ENCOUNTER — Other Ambulatory Visit: Payer: Self-pay

## 2017-10-07 ENCOUNTER — Encounter (HOSPITAL_COMMUNITY): Payer: Self-pay | Admitting: *Deleted

## 2017-10-07 ENCOUNTER — Inpatient Hospital Stay (HOSPITAL_COMMUNITY)
Admission: RE | Admit: 2017-10-07 | Discharge: 2017-10-12 | DRG: 470 | Disposition: A | Discharge status: Home Health | Payer: Non-veteran care | Source: Ambulatory Visit | Attending: Orthopedic Surgery | Admitting: Orthopedic Surgery

## 2017-10-07 ENCOUNTER — Inpatient Hospital Stay (HOSPITAL_COMMUNITY): Payer: Non-veteran care | Admitting: Emergency Medicine

## 2017-10-07 DIAGNOSIS — E119 Type 2 diabetes mellitus without complications: Secondary | ICD-10-CM

## 2017-10-07 DIAGNOSIS — K5900 Constipation, unspecified: Secondary | ICD-10-CM | POA: Diagnosis not present

## 2017-10-07 DIAGNOSIS — I10 Essential (primary) hypertension: Secondary | ICD-10-CM | POA: Diagnosis present

## 2017-10-07 DIAGNOSIS — M1711 Unilateral primary osteoarthritis, right knee: Principal | ICD-10-CM | POA: Diagnosis present

## 2017-10-07 DIAGNOSIS — E1165 Type 2 diabetes mellitus with hyperglycemia: Secondary | ICD-10-CM | POA: Diagnosis present

## 2017-10-07 DIAGNOSIS — M96830 Postprocedural hemorrhage and hematoma of a musculoskeletal structure following a musculoskeletal system procedure: Secondary | ICD-10-CM | POA: Diagnosis not present

## 2017-10-07 DIAGNOSIS — F419 Anxiety disorder, unspecified: Secondary | ICD-10-CM | POA: Diagnosis present

## 2017-10-07 DIAGNOSIS — N179 Acute kidney failure, unspecified: Secondary | ICD-10-CM | POA: Diagnosis not present

## 2017-10-07 DIAGNOSIS — Z6841 Body Mass Index (BMI) 40.0 and over, adult: Secondary | ICD-10-CM | POA: Diagnosis not present

## 2017-10-07 DIAGNOSIS — Z79899 Other long term (current) drug therapy: Secondary | ICD-10-CM | POA: Diagnosis not present

## 2017-10-07 DIAGNOSIS — W1830XA Fall on same level, unspecified, initial encounter: Secondary | ICD-10-CM | POA: Diagnosis not present

## 2017-10-07 DIAGNOSIS — Z96651 Presence of right artificial knee joint: Secondary | ICD-10-CM

## 2017-10-07 DIAGNOSIS — G4733 Obstructive sleep apnea (adult) (pediatric): Secondary | ICD-10-CM | POA: Diagnosis present

## 2017-10-07 DIAGNOSIS — Z9841 Cataract extraction status, right eye: Secondary | ICD-10-CM

## 2017-10-07 DIAGNOSIS — Z880 Allergy status to penicillin: Secondary | ICD-10-CM | POA: Diagnosis not present

## 2017-10-07 DIAGNOSIS — Z981 Arthrodesis status: Secondary | ICD-10-CM

## 2017-10-07 DIAGNOSIS — G9340 Encephalopathy, unspecified: Secondary | ICD-10-CM | POA: Diagnosis not present

## 2017-10-07 DIAGNOSIS — R0602 Shortness of breath: Secondary | ICD-10-CM

## 2017-10-07 DIAGNOSIS — Z91030 Bee allergy status: Secondary | ICD-10-CM | POA: Diagnosis not present

## 2017-10-07 DIAGNOSIS — Z794 Long term (current) use of insulin: Secondary | ICD-10-CM | POA: Diagnosis not present

## 2017-10-07 DIAGNOSIS — Z9842 Cataract extraction status, left eye: Secondary | ICD-10-CM

## 2017-10-07 DIAGNOSIS — F329 Major depressive disorder, single episode, unspecified: Secondary | ICD-10-CM | POA: Diagnosis present

## 2017-10-07 DIAGNOSIS — Y92230 Patient room in hospital as the place of occurrence of the external cause: Secondary | ICD-10-CM | POA: Diagnosis not present

## 2017-10-07 DIAGNOSIS — E871 Hypo-osmolality and hyponatremia: Secondary | ICD-10-CM | POA: Diagnosis present

## 2017-10-07 HISTORY — PX: TOTAL KNEE ARTHROPLASTY: SHX125

## 2017-10-07 LAB — GLUCOSE, CAPILLARY
GLUCOSE-CAPILLARY: 114 mg/dL — AB (ref 65–99)
GLUCOSE-CAPILLARY: 148 mg/dL — AB (ref 65–99)
Glucose-Capillary: 246 mg/dL — ABNORMAL HIGH (ref 65–99)
Glucose-Capillary: 313 mg/dL — ABNORMAL HIGH (ref 65–99)

## 2017-10-07 SURGERY — ARTHROPLASTY, KNEE, TOTAL
Anesthesia: Spinal | Site: Knee | Laterality: Right

## 2017-10-07 MED ORDER — 0.9 % SODIUM CHLORIDE (POUR BTL) OPTIME
TOPICAL | Status: DC | PRN
  Administered 2017-10-07: 1000 mL

## 2017-10-07 MED ORDER — LONGEVITY PO CAPS: 1.0000 | ORAL_CAPSULE | Freq: Every day | ORAL | Status: DC

## 2017-10-07 MED ORDER — HYDROMORPHONE HCL 1 MG/ML IJ SOLN
1.0000 mg | INTRAMUSCULAR | Status: DC | PRN
  Administered 2017-10-08 – 2017-10-09 (×6): 1 mg via INTRAVENOUS
  Filled 2017-10-07 (×6): qty 1

## 2017-10-07 MED ORDER — BUPIVACAINE-EPINEPHRINE (PF) 0.25% -1:200000 IJ SOLN
INTRAMUSCULAR | Status: AC
  Filled 2017-10-07: qty 30

## 2017-10-07 MED ORDER — PHENYLEPHRINE HCL 10 MG/ML IJ SOLN
INTRAVENOUS | Status: DC | PRN
  Administered 2017-10-07: 25 ug/min via INTRAVENOUS

## 2017-10-07 MED ORDER — LORATADINE 10 MG PO TABS: 10.0000 mg | ORAL_TABLET | Freq: Every day | ORAL | Status: DC | PRN

## 2017-10-07 MED ORDER — BISACODYL 10 MG RE SUPP: 10.0000 mg | Freq: Every day | RECTAL | Status: DC | PRN

## 2017-10-07 MED ORDER — TRANEXAMIC ACID 1000 MG/10ML IV SOLN
1000.0000 mg | Freq: Once | INTRAVENOUS | Status: DC
  Filled 2017-10-07 (×2): qty 10

## 2017-10-07 MED ORDER — GABAPENTIN 300 MG PO CAPS
ORAL_CAPSULE | ORAL | Status: AC
  Administered 2017-10-07: 11:00:00
  Filled 2017-10-07: qty 2

## 2017-10-07 MED ORDER — ONDANSETRON HCL 4 MG/2ML IJ SOLN
INTRAMUSCULAR | Status: AC
  Filled 2017-10-07: qty 2

## 2017-10-07 MED ORDER — ONDANSETRON HCL 4 MG/2ML IJ SOLN: 4.0000 mg | Freq: Four times a day (QID) | INTRAMUSCULAR | Status: DC | PRN

## 2017-10-07 MED ORDER — HYDROMORPHONE HCL 1 MG/ML IJ SOLN
0.5000 mg | INTRAMUSCULAR | Status: DC | PRN
  Administered 2017-10-07: 0.5 mg via INTRAVENOUS
  Filled 2017-10-07: qty 1

## 2017-10-07 MED ORDER — SODIUM CHLORIDE 0.9 % IR SOLN
Status: DC | PRN
  Administered 2017-10-07: 3000 mL

## 2017-10-07 MED ORDER — DSS 100 MG PO CAPS: 100.0000 mg | ORAL_CAPSULE | Freq: Two times a day (BID) | ORAL | Status: DC

## 2017-10-07 MED ORDER — SILDENAFIL CITRATE 100 MG PO TABS: 50.0000 mg | ORAL_TABLET | Freq: Every day | ORAL | Status: DC | PRN

## 2017-10-07 MED ORDER — OXYCODONE HCL 5 MG PO TABS
5.0000 mg | ORAL_TABLET | ORAL | Status: DC | PRN
  Administered 2017-10-07: 5 mg via ORAL
  Filled 2017-10-07: qty 1

## 2017-10-07 MED ORDER — METFORMIN HCL 500 MG PO TABS
1000.0000 mg | ORAL_TABLET | Freq: Two times a day (BID) | ORAL | Status: DC
  Administered 2017-10-07 – 2017-10-12 (×8): 1000 mg via ORAL
  Filled 2017-10-07 (×8): qty 2

## 2017-10-07 MED ORDER — FENTANYL CITRATE (PF) 100 MCG/2ML IJ SOLN
INTRAMUSCULAR | Status: AC
  Administered 2017-10-07: 100 ug via INTRAVENOUS
  Filled 2017-10-07: qty 2

## 2017-10-07 MED ORDER — GABAPENTIN 400 MG PO CAPS
800.0000 mg | ORAL_CAPSULE | Freq: Two times a day (BID) | ORAL | Status: DC
  Administered 2017-10-07 – 2017-10-10 (×7): 800 mg via ORAL
  Filled 2017-10-07 (×7): qty 2

## 2017-10-07 MED ORDER — SODIUM CHLORIDE 0.9 % IV SOLN
INTRAVENOUS | Status: DC
  Administered 2017-10-07: 50 mL/h via INTRAVENOUS

## 2017-10-07 MED ORDER — FERROUS SULFATE 325 (65 FE) MG PO TABS
325.0000 mg | ORAL_TABLET | Freq: Three times a day (TID) | ORAL | Status: DC
  Administered 2017-10-08 – 2017-10-12 (×11): 325 mg via ORAL
  Filled 2017-10-07 (×11): qty 1

## 2017-10-07 MED ORDER — HYDROMORPHONE HCL 1 MG/ML IJ SOLN
1.0000 mg | Freq: Once | INTRAMUSCULAR | Status: AC
  Administered 2017-10-07: 1 mg via INTRAVENOUS
  Filled 2017-10-07: qty 1

## 2017-10-07 MED ORDER — MIDAZOLAM HCL 2 MG/2ML IJ SOLN
INTRAMUSCULAR | Status: AC
  Administered 2017-10-07: 2 mg via INTRAVENOUS
  Filled 2017-10-07: qty 2

## 2017-10-07 MED ORDER — CLINDAMYCIN PHOSPHATE 600 MG/50ML IV SOLN
600.0000 mg | Freq: Four times a day (QID) | INTRAVENOUS | Status: AC
  Administered 2017-10-07 – 2017-10-08 (×2): 600 mg via INTRAVENOUS
  Filled 2017-10-07 (×2): qty 50

## 2017-10-07 MED ORDER — ACETAMINOPHEN 650 MG RE SUPP: 650.0000 mg | RECTAL | Status: DC | PRN

## 2017-10-07 MED ORDER — BISACODYL 5 MG PO TBEC
5.0000 mg | DELAYED_RELEASE_TABLET | Freq: Two times a day (BID) | ORAL | Status: DC
  Administered 2017-10-08 – 2017-10-12 (×8): 5 mg via ORAL
  Filled 2017-10-07 (×9): qty 1

## 2017-10-07 MED ORDER — OXYCODONE-ACETAMINOPHEN 10-325 MG PO TABS: 1.0000 | ORAL_TABLET | ORAL | 0 refills | Status: DC | PRN

## 2017-10-07 MED ORDER — MAGNESIUM OXIDE 400 (241.3 MG) MG PO TABS
400.0000 mg | ORAL_TABLET | Freq: Two times a day (BID) | ORAL | Status: DC
  Administered 2017-10-08 – 2017-10-12 (×8): 400 mg via ORAL
  Filled 2017-10-07 (×9): qty 1

## 2017-10-07 MED ORDER — BUPROPION HCL 100 MG PO TABS
100.0000 mg | ORAL_TABLET | Freq: Every day | ORAL | Status: DC
  Administered 2017-10-07 – 2017-10-10 (×4): 100 mg via ORAL
  Filled 2017-10-07 (×6): qty 1

## 2017-10-07 MED ORDER — ACETAMINOPHEN 325 MG PO TABS: 650.0000 mg | ORAL_TABLET | ORAL | Status: DC | PRN

## 2017-10-07 MED ORDER — LACTATED RINGERS IV SOLN
INTRAVENOUS | Status: DC
  Administered 2017-10-07: 11:00:00 via INTRAVENOUS

## 2017-10-07 MED ORDER — DOCUSATE SODIUM 100 MG PO CAPS
100.0000 mg | ORAL_CAPSULE | Freq: Two times a day (BID) | ORAL | Status: DC
  Administered 2017-10-08 – 2017-10-12 (×8): 100 mg via ORAL
  Filled 2017-10-07 (×9): qty 1

## 2017-10-07 MED ORDER — OXYCODONE HCL 5 MG PO TABS
5.0000 mg | ORAL_TABLET | ORAL | Status: DC | PRN
  Filled 2017-10-07 (×2): qty 3

## 2017-10-07 MED ORDER — CHLORHEXIDINE GLUCONATE 4 % EX LIQD
60.0000 mL | Freq: Once | CUTANEOUS | Status: DC
  Administered 2017-10-07: 4 via TOPICAL

## 2017-10-07 MED ORDER — METHOCARBAMOL 500 MG PO TABS
ORAL_TABLET | ORAL | Status: AC
  Administered 2017-10-07: 500 mg via ORAL
  Filled 2017-10-07: qty 1

## 2017-10-07 MED ORDER — INSULIN ASPART 100 UNIT/ML ~~LOC~~ SOLN
0.0000 [IU] | Freq: Every day | SUBCUTANEOUS | Status: DC
  Administered 2017-10-11: 2 [IU] via SUBCUTANEOUS

## 2017-10-07 MED ORDER — INSULIN ASPART 100 UNIT/ML ~~LOC~~ SOLN
6.0000 [IU] | Freq: Three times a day (TID) | SUBCUTANEOUS | Status: DC
  Administered 2017-10-07 – 2017-10-10 (×7): 6 [IU] via SUBCUTANEOUS

## 2017-10-07 MED ORDER — ONDANSETRON HCL 4 MG PO TABS: 4.0000 mg | ORAL_TABLET | Freq: Four times a day (QID) | ORAL | Status: DC | PRN

## 2017-10-07 MED ORDER — TRAZODONE HCL 100 MG PO TABS
100.0000 mg | ORAL_TABLET | Freq: Every day | ORAL | Status: DC
  Administered 2017-10-10 – 2017-10-11 (×2): 100 mg via ORAL
  Filled 2017-10-07 (×5): qty 1

## 2017-10-07 MED ORDER — ADULT MULTIVITAMIN W/MINERALS CH
1.0000 | ORAL_TABLET | Freq: Every day | ORAL | Status: DC
  Administered 2017-10-08 – 2017-10-12 (×4): 1 via ORAL
  Filled 2017-10-07 (×4): qty 1

## 2017-10-07 MED ORDER — ACETAMINOPHEN 500 MG PO TABS
ORAL_TABLET | ORAL | Status: AC
  Administered 2017-10-07: 11:00:00
  Filled 2017-10-07: qty 2

## 2017-10-07 MED ORDER — GABAPENTIN 300 MG PO CAPS
600.0000 mg | ORAL_CAPSULE | Freq: Once | ORAL | Status: AC
  Administered 2017-10-07: 600 mg via ORAL

## 2017-10-07 MED ORDER — METOCLOPRAMIDE HCL 5 MG PO TABS: 5.0000 mg | ORAL_TABLET | Freq: Three times a day (TID) | ORAL | Status: DC | PRN

## 2017-10-07 MED ORDER — VENLAFAXINE HCL ER 150 MG PO CP24
150.0000 mg | ORAL_CAPSULE | Freq: Every day | ORAL | Status: DC
  Administered 2017-10-08 – 2017-10-12 (×4): 150 mg via ORAL
  Filled 2017-10-07 (×4): qty 1

## 2017-10-07 MED ORDER — METOCLOPRAMIDE HCL 5 MG/ML IJ SOLN: 5.0000 mg | Freq: Three times a day (TID) | INTRAMUSCULAR | Status: DC | PRN

## 2017-10-07 MED ORDER — PROPOFOL 10 MG/ML IV BOLUS
INTRAVENOUS | Status: AC
  Filled 2017-10-07: qty 20

## 2017-10-07 MED ORDER — INSULIN ASPART 100 UNIT/ML ~~LOC~~ SOLN
0.0000 [IU] | Freq: Three times a day (TID) | SUBCUTANEOUS | Status: DC
  Administered 2017-10-07: 7 [IU] via SUBCUTANEOUS
  Administered 2017-10-08: 3 [IU] via SUBCUTANEOUS
  Administered 2017-10-08: 7 [IU] via SUBCUTANEOUS
  Administered 2017-10-08: 3 [IU] via SUBCUTANEOUS
  Administered 2017-10-09: 4 [IU] via SUBCUTANEOUS
  Administered 2017-10-09 – 2017-10-10 (×2): 3 [IU] via SUBCUTANEOUS
  Administered 2017-10-11: 4 [IU] via SUBCUTANEOUS
  Administered 2017-10-12: 7 [IU] via SUBCUTANEOUS
  Administered 2017-10-12: 4 [IU] via SUBCUTANEOUS

## 2017-10-07 MED ORDER — BUPROPION HCL 100 MG PO TABS: 100.0000 mg | ORAL_TABLET | Freq: Two times a day (BID) | ORAL | Status: DC

## 2017-10-07 MED ORDER — ASPIRIN 81 MG PO CHEW
81.0000 mg | CHEWABLE_TABLET | Freq: Two times a day (BID) | ORAL | Status: DC
  Administered 2017-10-07 – 2017-10-12 (×9): 81 mg via ORAL
  Filled 2017-10-07 (×9): qty 1

## 2017-10-07 MED ORDER — CALCIUM CARBONATE ANTACID 500 MG PO CHEW
1.0000 | CHEWABLE_TABLET | Freq: Two times a day (BID) | ORAL | Status: DC
  Administered 2017-10-08 (×2): 400 mg via ORAL
  Administered 2017-10-09 – 2017-10-12 (×5): 200 mg via ORAL
  Filled 2017-10-07 (×2): qty 1
  Filled 2017-10-07 (×2): qty 2
  Filled 2017-10-07 (×3): qty 1
  Filled 2017-10-07: qty 2
  Filled 2017-10-07: qty 1

## 2017-10-07 MED ORDER — LURASIDONE HCL 40 MG PO TABS
40.0000 mg | ORAL_TABLET | Freq: Every evening | ORAL | Status: DC
  Administered 2017-10-07 – 2017-10-10 (×4): 40 mg via ORAL
  Filled 2017-10-07 (×6): qty 1

## 2017-10-07 MED ORDER — METHOCARBAMOL 1000 MG/10ML IJ SOLN
500.0000 mg | Freq: Four times a day (QID) | INTRAMUSCULAR | Status: DC | PRN
  Filled 2017-10-07: qty 5

## 2017-10-07 MED ORDER — METHOCARBAMOL 500 MG PO TABS
500.0000 mg | ORAL_TABLET | Freq: Four times a day (QID) | ORAL | Status: DC | PRN
  Administered 2017-10-07 – 2017-10-12 (×12): 500 mg via ORAL
  Filled 2017-10-07 (×12): qty 1

## 2017-10-07 MED ORDER — ASPIRIN 81 MG PO CHEW: 81.0000 mg | CHEWABLE_TABLET | Freq: Two times a day (BID) | ORAL | 1 refills | Status: DC

## 2017-10-07 MED ORDER — PROPOFOL 500 MG/50ML IV EMUL
INTRAVENOUS | Status: DC | PRN
  Administered 2017-10-07: 100 ug/kg/min via INTRAVENOUS
  Administered 2017-10-07: 14:00:00 via INTRAVENOUS

## 2017-10-07 MED ORDER — ROPIVACAINE HCL 7.5 MG/ML IJ SOLN
INTRAMUSCULAR | Status: DC | PRN
  Administered 2017-10-07: 20 mL via PERINEURAL

## 2017-10-07 MED ORDER — OXYCODONE-ACETAMINOPHEN 10-325 MG PO TABS: 1.0000 | ORAL_TABLET | ORAL | Status: DC | PRN

## 2017-10-07 MED ORDER — DEXAMETHASONE SODIUM PHOSPHATE 10 MG/ML IJ SOLN
INTRAMUSCULAR | Status: AC
  Filled 2017-10-07: qty 1

## 2017-10-07 MED ORDER — ACETAMINOPHEN 500 MG PO TABS
1000.0000 mg | ORAL_TABLET | Freq: Once | ORAL | Status: AC
  Administered 2017-10-07: 1000 mg via ORAL

## 2017-10-07 MED ORDER — GABAPENTIN 800 MG PO TABS
800.0000 mg | ORAL_TABLET | Freq: Two times a day (BID) | ORAL | Status: DC
  Filled 2017-10-07 (×2): qty 1

## 2017-10-07 MED ORDER — INSULIN ASPART PROT & ASPART (70-30 MIX) 100 UNIT/ML ~~LOC~~ SUSP
60.0000 [IU] | Freq: Two times a day (BID) | SUBCUTANEOUS | Status: DC
  Administered 2017-10-07 – 2017-10-12 (×8): 60 [IU] via SUBCUTANEOUS
  Filled 2017-10-07 (×2): qty 10

## 2017-10-07 MED ORDER — ONDANSETRON HCL 4 MG/2ML IJ SOLN
INTRAMUSCULAR | Status: DC | PRN
  Administered 2017-10-07: 4 mg via INTRAVENOUS

## 2017-10-07 MED ORDER — POLYETHYLENE GLYCOL 3350 17 G PO PACK: 17.0000 g | PACK | Freq: Every day | ORAL | Status: DC | PRN

## 2017-10-07 MED ORDER — HYDROMORPHONE HCL 1 MG/ML IJ SOLN
INTRAMUSCULAR | Status: AC
  Administered 2017-10-07: 0.5 mg via INTRAVENOUS
  Filled 2017-10-07: qty 1

## 2017-10-07 MED ORDER — FENTANYL CITRATE (PF) 100 MCG/2ML IJ SOLN
100.0000 ug | Freq: Once | INTRAMUSCULAR | Status: AC
  Administered 2017-10-07: 100 ug via INTRAVENOUS

## 2017-10-07 MED ORDER — MENTHOL 3 MG MT LOZG: 1.0000 | LOZENGE | OROMUCOSAL | Status: DC | PRN

## 2017-10-07 MED ORDER — ACETAMINOPHEN 325 MG PO TABS
650.0000 mg | ORAL_TABLET | ORAL | Status: DC | PRN
Start: 2017-10-07 — End: 2017-10-08
  Administered 2017-10-07 – 2017-10-08 (×2): 650 mg via ORAL
  Filled 2017-10-07 (×2): qty 2

## 2017-10-07 MED ORDER — HYDROMORPHONE HCL 1 MG/ML IJ SOLN
0.2500 mg | INTRAMUSCULAR | Status: DC | PRN
  Administered 2017-10-07 (×4): 0.5 mg via INTRAVENOUS

## 2017-10-07 MED ORDER — PHENOL 1.4 % MT LIQD: 1.0000 | OROMUCOSAL | Status: DC | PRN

## 2017-10-07 MED ORDER — MIDAZOLAM HCL 2 MG/2ML IJ SOLN
2.0000 mg | Freq: Once | INTRAMUSCULAR | Status: AC
Start: 2017-10-07 — End: 2017-10-07
  Administered 2017-10-07: 2 mg via INTRAVENOUS

## 2017-10-07 MED ORDER — OXYCODONE-ACETAMINOPHEN 5-325 MG PO TABS
1.0000 | ORAL_TABLET | ORAL | Status: DC | PRN
  Administered 2017-10-07: 1 via ORAL
  Filled 2017-10-07: qty 1

## 2017-10-07 MED ORDER — BUPIVACAINE IN DEXTROSE 0.75-8.25 % IT SOLN
INTRATHECAL | Status: DC | PRN
  Administered 2017-10-07: 15 mg via INTRATHECAL

## 2017-10-07 MED ORDER — DEXAMETHASONE SODIUM PHOSPHATE 10 MG/ML IJ SOLN
INTRAMUSCULAR | Status: DC | PRN
  Administered 2017-10-07: 5 mg via INTRAVENOUS

## 2017-10-07 MED ORDER — BUPROPION HCL 100 MG PO TABS
200.0000 mg | ORAL_TABLET | Freq: Every day | ORAL | Status: DC
  Administered 2017-10-08 – 2017-10-12 (×4): 200 mg via ORAL
  Filled 2017-10-07 (×5): qty 2

## 2017-10-07 MED ORDER — FLUTICASONE PROPIONATE 50 MCG/ACT NA SUSP
1.0000 | Freq: Two times a day (BID) | NASAL | Status: DC | PRN
Start: 2017-10-07 — End: 2017-10-12

## 2017-10-07 SURGICAL SUPPLY — 68 items
BANDAGE ESMARK 6X9 LF (GAUZE/BANDAGES/DRESSINGS) ×1 IMPLANT
BLADE SAG 18X100X1.27 (BLADE) ×3 IMPLANT
BLADE SAW SGTL 13X75X1.27 (BLADE) ×3 IMPLANT
BNDG CMPR 9X6 STRL LF SNTH (GAUZE/BANDAGES/DRESSINGS) ×1
BNDG CMPR MED 10X6 ELC LF (GAUZE/BANDAGES/DRESSINGS) ×1
BNDG ELASTIC 6X10 VLCR STRL LF (GAUZE/BANDAGES/DRESSINGS) ×3 IMPLANT
BNDG ESMARK 6X9 LF (GAUZE/BANDAGES/DRESSINGS) ×3
BNDG GAUZE ELAST 4 BULKY (GAUZE/BANDAGES/DRESSINGS) ×6 IMPLANT
BOWL SMART MIX CTS (DISPOSABLE) ×3 IMPLANT
CAP KNEE TOTAL 3 SIGMA ×2 IMPLANT
CEMENT HV SMART SET (Cement) ×6 IMPLANT
CLOSURE WOUND 1/2 X4 (GAUZE/BANDAGES/DRESSINGS) ×1
COVER SURGICAL LIGHT HANDLE (MISCELLANEOUS) ×3 IMPLANT
CUFF TOURNIQUET SINGLE 34IN LL (TOURNIQUET CUFF) ×2 IMPLANT
CUFF TOURNIQUET SINGLE 44IN (TOURNIQUET CUFF) IMPLANT
DRAPE EXTREMITY T 121X128X90 (DRAPE) ×3 IMPLANT
DRAPE HALF SHEET 40X57 (DRAPES) ×3 IMPLANT
DRAPE U-SHAPE 47X51 STRL (DRAPES) ×3 IMPLANT
DRSG ADAPTIC 3X8 NADH LF (GAUZE/BANDAGES/DRESSINGS) ×3 IMPLANT
DRSG PAD ABDOMINAL 8X10 ST (GAUZE/BANDAGES/DRESSINGS) ×2 IMPLANT
DURAPREP 26ML APPLICATOR (WOUND CARE) ×5 IMPLANT
ELECT CAUTERY BLADE 6.4 (BLADE) ×3 IMPLANT
ELECT REM PT RETURN 9FT ADLT (ELECTROSURGICAL) ×3
ELECTRODE REM PT RTRN 9FT ADLT (ELECTROSURGICAL) ×1 IMPLANT
GAUZE SPONGE 4X4 12PLY STRL (GAUZE/BANDAGES/DRESSINGS) ×1 IMPLANT
GAUZE SPONGE 4X4 12PLY STRL LF (GAUZE/BANDAGES/DRESSINGS) ×2 IMPLANT
GLOVE BIOGEL PI IND STRL 6.5 (GLOVE) IMPLANT
GLOVE BIOGEL PI IND STRL 7.0 (GLOVE) IMPLANT
GLOVE BIOGEL PI INDICATOR 6.5 (GLOVE) ×4
GLOVE BIOGEL PI INDICATOR 7.0 (GLOVE) ×2
GLOVE BIOGEL PI ORTHO PRO 7.5 (GLOVE) ×2
GLOVE BIOGEL PI ORTHO PRO SZ8 (GLOVE) ×2
GLOVE ORTHO TXT STRL SZ7.5 (GLOVE) ×3 IMPLANT
GLOVE PI ORTHO PRO STRL 7.5 (GLOVE) ×1 IMPLANT
GLOVE PI ORTHO PRO STRL SZ8 (GLOVE) ×1 IMPLANT
GLOVE SURG ORTHO 8.5 STRL (GLOVE) ×3 IMPLANT
GLOVE SURG SS PI 6.0 STRL IVOR (GLOVE) ×2 IMPLANT
GOWN STRL REUS W/ TWL LRG LVL3 (GOWN DISPOSABLE) IMPLANT
GOWN STRL REUS W/ TWL XL LVL3 (GOWN DISPOSABLE) ×3 IMPLANT
GOWN STRL REUS W/TWL LRG LVL3 (GOWN DISPOSABLE) ×6
GOWN STRL REUS W/TWL XL LVL3 (GOWN DISPOSABLE) ×6
HANDPIECE INTERPULSE COAX TIP (DISPOSABLE) ×3
IMMOBILIZER KNEE 22 (SOFTGOODS) ×2 IMPLANT
IMMOBILIZER KNEE 22 UNIV (SOFTGOODS) ×2 IMPLANT
KIT BASIN OR (CUSTOM PROCEDURE TRAY) ×3 IMPLANT
KIT MANIFOLD (MISCELLANEOUS) ×3 IMPLANT
KIT ROOM TURNOVER OR (KITS) ×3 IMPLANT
MANIFOLD NEPTUNE II (INSTRUMENTS) ×3 IMPLANT
NS IRRIG 1000ML POUR BTL (IV SOLUTION) ×3 IMPLANT
PACK TOTAL JOINT (CUSTOM PROCEDURE TRAY) ×3 IMPLANT
PAD ABD 8X10 STRL (GAUZE/BANDAGES/DRESSINGS) ×4 IMPLANT
PAD ARMBOARD 7.5X6 YLW CONV (MISCELLANEOUS) ×6 IMPLANT
SET HNDPC FAN SPRY TIP SCT (DISPOSABLE) ×1 IMPLANT
STRIP CLOSURE SKIN 1/2X4 (GAUZE/BANDAGES/DRESSINGS) ×3 IMPLANT
SUCTION FRAZIER HANDLE 10FR (MISCELLANEOUS) ×2
SUCTION TUBE FRAZIER 10FR DISP (MISCELLANEOUS) ×1 IMPLANT
SUT MNCRL AB 3-0 PS2 18 (SUTURE) ×3 IMPLANT
SUT VIC AB 0 CT1 27 (SUTURE) ×6
SUT VIC AB 0 CT1 27XBRD ANBCTR (SUTURE) ×2 IMPLANT
SUT VIC AB 1 CT1 27 (SUTURE) ×9
SUT VIC AB 1 CT1 27XBRD ANBCTR (SUTURE) ×3 IMPLANT
SUT VIC AB 2-0 CT1 27 (SUTURE) ×3
SUT VIC AB 2-0 CT1 TAPERPNT 27 (SUTURE) ×2 IMPLANT
TOWEL OR 17X24 6PK STRL BLUE (TOWEL DISPOSABLE) ×1 IMPLANT
TOWEL OR 17X26 10 PK STRL BLUE (TOWEL DISPOSABLE) ×3 IMPLANT
TRAY CATH 16FR W/PLASTIC CATH (SET/KITS/TRAYS/PACK) IMPLANT
TRAY FOLEY W/METER SILVER 16FR (SET/KITS/TRAYS/PACK) ×2 IMPLANT
UPCHARGE REV TRAY MBT KNEE ×2 IMPLANT

## 2017-10-07 NOTE — Anesthesia Procedure Notes (Signed)
Anesthesia Regional Block: Adductor canal block   Pre-Anesthetic Checklist: ,, timeout performed, Correct Patient, Correct Site, Correct Laterality, Correct Procedure, Correct Position, site marked, Risks and benefits discussed, pre-op evaluation,  At surgeon's request and post-op pain management  Laterality: Right  Prep: Maximum Sterile Barrier Precautions used, chloraprep       Needles:  Injection technique: Single-shot  Needle Type: Echogenic Stimulator Needle     Needle Length: 9cm  Needle Gauge: 21     Additional Needles:   Procedures:,,,, ultrasound used (permanent image in chart),,,,  Narrative:  Start time: 10/07/2017 10:50 AM End time: 10/07/2017 11:00 AM Injection made incrementally with aspirations every 5 mL.  Performed by: Personally  Anesthesiologist: Gaynelle AduFitzgerald, Ghalia Reicks, MD  Additional Notes: 2% Lidocaine skin wheel.

## 2017-10-07 NOTE — Anesthesia Preprocedure Evaluation (Addendum)
Anesthesia Evaluation  Patient identified by MRN, date of birth, ID band Patient awake    Reviewed: Allergy & Precautions, H&P , NPO status , Patient's Chart, lab work & pertinent test results  Airway Mallampati: III  TM Distance: >3 FB Neck ROM: Full    Dental no notable dental hx. (+) Upper Dentures, Dental Advisory Given   Pulmonary sleep apnea and Continuous Positive Airway Pressure Ventilation ,    Pulmonary exam normal breath sounds clear to auscultation       Cardiovascular hypertension, Pt. on medications  Rhythm:Regular Rate:Normal     Neuro/Psych  Headaches, Anxiety Depression    GI/Hepatic negative GI ROS, Neg liver ROS,   Endo/Other  diabetes, Insulin Dependent, Oral Hypoglycemic AgentsMorbid obesity  Renal/GU negative Renal ROS  negative genitourinary   Musculoskeletal  (+) Arthritis , Osteoarthritis,    Abdominal   Peds  Hematology negative hematology ROS (+)   Anesthesia Other Findings   Reproductive/Obstetrics negative OB ROS                            Anesthesia Physical Anesthesia Plan  ASA: III  Anesthesia Plan: Spinal   Post-op Pain Management:  Regional for Post-op pain   Induction: Intravenous  PONV Risk Score and Plan: 2 and Ondansetron, Dexamethasone, Propofol infusion and Midazolam  Airway Management Planned: Simple Face Mask  Additional Equipment:   Intra-op Plan:   Post-operative Plan:   Informed Consent: I have reviewed the patients History and Physical, chart, labs and discussed the procedure including the risks, benefits and alternatives for the proposed anesthesia with the patient or authorized representative who has indicated his/her understanding and acceptance.   Dental advisory given  Plan Discussed with: CRNA  Anesthesia Plan Comments:         Anesthesia Quick Evaluation

## 2017-10-07 NOTE — Transfer of Care (Signed)
Immediate Anesthesia Transfer of Care Note  Patient: Ronald Atkinson  Procedure(s) Performed: RIGHT TOTAL KNEE ARTHROPLASTY (Right Knee)  Patient Location: PACU  Anesthesia Type:Spinal  Level of Consciousness: awake, alert  and oriented  Airway & Oxygen Therapy: Patient Spontanous Breathing and Patient connected to nasal cannula oxygen  Post-op Assessment: Report given to RN and Post -op Vital signs reviewed and stable  Post vital signs: Reviewed and stable  Last Vitals:  Vitals:   10/07/17 1020  BP: (!) 179/96  Pulse: 84  Resp: 20  Temp: 37.1 C  SpO2: 97%    Last Pain:  Vitals:   10/07/17 1041  TempSrc:   PainSc: 10-Worst pain ever      Patients Stated Pain Goal: 3 (10/07/17 1041)  Complications: No apparent anesthesia complications

## 2017-10-07 NOTE — Care Management Note (Signed)
Case Management Note  Patient Details  Name: Ronald Atkinson MRN: [id number] Date of Birth: 1956-10-14  Subjective/Objective:    61 yr old gentleman s/p right total knee arthroplasty.                Action/Plan: Case manager has spoken with patient and wife concerning discharge plan and DME. Patient was preoperatively setup with [facility], no changes. He has RW and 3in1. Will have family support at discharge.    Expected Discharge Date:   pending               Expected Discharge Plan:  Home w Home Health Services  In-House Referral:     Discharge planning Services  CM Consult  Post Acute Care Choice:  Home Health Choice offered to:  Spouse, Patient  DME Arranged:  CPM(has RW and 3in1) DME Agency:  [employer]  HH Arranged:  PT HH Agency:  [facility] (formerly [facility])  Status of Service:  In process, will continue to follow  If discussed at Long Length of Stay Meetings, dates discussed:    Additional Comments:  Durenda GuthrieBrady, Pacer Dorn Naomi, RN 10/07/2017, 3:10 PM

## 2017-10-07 NOTE — Anesthesia Postprocedure Evaluation (Signed)
Anesthesia Post Note  Patient: Ronald Atkinson  Procedure(s) Performed: RIGHT TOTAL KNEE ARTHROPLASTY (Right Knee)     Patient location during evaluation: PACU Anesthesia Type: Spinal Level of consciousness: awake and alert Pain management: pain level controlled Vital Signs Assessment: post-procedure vital signs reviewed and stable Respiratory status: spontaneous breathing and respiratory function stable Cardiovascular status: blood pressure returned to baseline and stable Postop Assessment: spinal receding Anesthetic complications: no    Last Vitals:  Vitals:   10/07/17 1450 10/07/17 1505  BP: 134/87 (!) 151/90  Pulse: 87 88  Resp: 13 13  Temp:    SpO2: 94% 95%    Last Pain:  Vitals:   10/07/17 1500  TempSrc:   PainSc: 5                  Deshunda Thackston DANIEL

## 2017-10-07 NOTE — Interval H&P Note (Signed)
History and Physical Interval Note:  10/07/2017 11:05 AM  [name]  has presented today for surgery, with the diagnosis of Right knee osteoarthritis, end stage   The various methods of treatment have been discussed with the patient and family. After consideration of risks, benefits and other options for treatment, the patient has consented to  Procedure(s): RIGHT TOTAL KNEE ARTHROPLASTY (Right) as a surgical intervention .  The patient's history has been reviewed, patient examined, no change in status, stable for surgery.  I have reviewed the patient's chart and labs.  Questions were answered to the patient's satisfaction.     Elvis Laufer,STEVEN R

## 2017-10-07 NOTE — Evaluation (Signed)
Physical Therapy Evaluation Patient Details Name: Ronald Atkinson MRN: [id number] DOB: February 17, 1956 Today's Date: 10/07/2017   History of Present Illness  Pt is a 61 y/o male s/p R TKA. PMH includes DM, anxiety, depression, HTN, ACDF, OSA on CPAP, and R shoulder replacement.   Clinical Impression  Pt is s/p surgery above with deficits below. PTA, pt was using cane for ambulation. Upon eval, pt very limited by pain and only able to tolerate gait to chair this session. Required mod A to stand and min guard to min A for steadying with RW. Reports wife will be able to assist at d/c and has all necessary DME. Follow up recommendations per MD arrangements; pt's wife reports pt will be getting HHPT. Will continue to follow acutely to maximize functional mobility independence and safety.     Follow Up Recommendations DC plan and follow up therapy as arranged by surgeon;Supervision for mobility/OOB    Equipment Recommendations  None recommended by PT    Recommendations for Other Services       Precautions / Restrictions Precautions Precautions: Knee Precaution Booklet Issued: Yes (comment) Precaution Comments: Reviewed supine knee ther ex.  Required Braces or Orthoses: Knee Immobilizer - Right Knee Immobilizer - Right: Other (comment)(until discontinued ) Restrictions Weight Bearing Restrictions: Yes RLE Weight Bearing: Weight bearing as tolerated      Mobility  Bed Mobility Overal bed mobility: Needs Assistance Bed Mobility: Supine to Sit     Supine to sit: Min assist     General bed mobility comments: Min A for trunk elevation and RLE assist during bed mobility.   Transfers Overall transfer level: Needs assistance Equipment used: Rolling walker (2 wheeled) Transfers: Sit to/from Stand Sit to Stand: Mod assist         General transfer comment: Mod A for lift assist to stand. Required 2 attempts to stand. Verbal cues for safe hand placement.    Ambulation/Gait Ambulation/Gait assistance: Min guard Ambulation Distance (Feet): 5 Feet Assistive device: Rolling walker (2 wheeled) Gait Pattern/deviations: Step-to pattern;Decreased step length - right;Decreased step length - left;Decreased weight shift to right;Antalgic Gait velocity: Decreased Gait velocity interpretation: Below normal speed for age/gender General Gait Details: Slow, antalgic gait. Distance limited secondary to pain. Decreased weightshift to RLE secondary to pain and very guarded. Verbal cues for sequencing using RW.   Stairs            Wheelchair Mobility    Modified Rankin (Stroke Patients Only)       Balance Overall balance assessment: Needs assistance Sitting-balance support: No upper extremity supported;Feet supported Sitting balance-Leahy Scale: Good     Standing balance support: Bilateral upper extremity supported;During functional activity Standing balance-Leahy Scale: Poor Standing balance comment: Reliant on RW for stability                              Pertinent Vitals/Pain Pain Assessment: 0-10 Pain Score: 10-Worst pain ever Pain Location: R knee  Pain Descriptors / Indicators: Aching;Operative site guarding Pain Intervention(s): Limited activity within patient's tolerance;Monitored during session;Repositioned    Home Living Family/patient expects to be discharged to:: Private residence Living Arrangements: Spouse/significant other Available Help at Discharge: Family;Available 24 hours/day Type of Home: House Home Access: Level entry     Home Layout: One level Home Equipment: Walker - 2 wheels;Bedside commode;Toilet riser;Cane - single point      Prior Function Level of Independence: Independent with assistive device(s)  Comments: Used cane for ambulation secondary to pain      Hand Dominance        Extremity/Trunk Assessment   Upper Extremity Assessment Upper Extremity Assessment: Defer to  OT evaluation    Lower Extremity Assessment Lower Extremity Assessment: RLE deficits/detail RLE Deficits / Details: Deficits consistent with post op pain and weakness. Reports numbness in foot. Able to perform ther ex below.     Cervical / Trunk Assessment Cervical / Trunk Assessment: Normal  Communication   Communication: No difficulties  Cognition Arousal/Alertness: Suspect due to medications Behavior During Therapy: WFL for tasks assessed/performed Overall Cognitive Status: Within Functional Limits for tasks assessed                                 General Comments: Falling asleep during some of the ther ex, however, easily arousable and alert throughout ambulation.       General Comments General comments (skin integrity, edema, etc.): Pt's wife present during session.     Exercises Total Joint Exercises Ankle Circles/Pumps: AROM;Both;20 reps Quad Sets: AROM;Right;10 reps Towel Squeeze: AROM;10 reps;Both Heel Slides: AROM;Right;10 reps   Assessment/Plan    PT Assessment Patient needs continued PT services  PT Problem List Decreased range of motion;Decreased strength;Decreased balance;Decreased mobility;Decreased activity tolerance;Decreased knowledge of use of DME;Decreased knowledge of precautions;Pain       PT Treatment Interventions DME instruction;Gait training;Functional mobility training;Therapeutic activities;Therapeutic exercise;Neuromuscular re-education;Balance training;Patient/family education    PT Goals (Current goals can be found in the Care Plan section)  Acute Rehab PT Goals Patient Stated Goal: to decrease pain PT Goal Formulation: With patient Time For Goal Achievement: 10/14/17 Potential to Achieve Goals: Good    Frequency 7X/week   Barriers to discharge        Co-evaluation               AM-PAC PT "6 Clicks" Daily Activity  Outcome Measure Difficulty turning over in bed (including adjusting bedclothes, sheets and  blankets)?: A Little Difficulty moving from lying on back to sitting on the side of the bed? : Unable Difficulty sitting down on and standing up from a chair with arms (e.g., wheelchair, bedside commode, etc,.)?: Unable Help needed moving to and from a bed to chair (including a wheelchair)?: A Little Help needed walking in hospital room?: A Little Help needed climbing 3-5 steps with a railing? : A Lot 6 Click Score: 13    End of Session Equipment Utilized During Treatment: Gait belt;Right knee immobilizer Activity Tolerance: Patient limited by pain Patient left: in chair;with call bell/phone within reach;with family/visitor present Nurse Communication: Mobility status;Patient requests pain meds PT Visit Diagnosis: Other abnormalities of gait and mobility (R26.89);Pain Pain - Right/Left: Right Pain - part of body: Knee    Time: 1730-1759 PT Time Calculation (min) (ACUTE ONLY): 29 min   Charges:   PT Evaluation $PT Eval Low Complexity: 1 Low PT Treatments $Gait Training: 8-22 mins   PT G Codes:        Gladys DammeBrittany Erion Weightman, PT, DPT  Acute Rehabilitation Services  Pager: [phone]   Lehman PromBrittany S Jelina Paulsen 10/07/2017, 6:18 PM

## 2017-10-07 NOTE — Progress Notes (Signed)
Orthopedic Tech Progress Note Patient Details:  Ronald Atkinson 09/06/1956 [id number]  CPM Right Knee CPM Right Knee: On Right Knee Flexion (Degrees): 90 Right Knee Extension (Degrees): 0   Ronald Atkinson 10/07/2017, 3:04 PM

## 2017-10-07 NOTE — Progress Notes (Signed)
Paged office for pain medication.  Patient states 10/10 pain with all PRN medications given at this time.  Orders given for 1 mg Dilaudid once, 1 mg Dilaudid q2 PRN, 5-15 mg oxycodone and 650 mg tylenol.

## 2017-10-07 NOTE — Discharge Instructions (Signed)
Ice to the knee as much as possible.  Prop under the ankle to encourage knee extension.  Weight bearing as tolerated Right LE.  Use the walker for balance.  Do exercises every hour.  Use the knee brace on at night to keep your knee straight.  Keep the incision clean and dry and covered for one week, then ok to shower and get it wet - no bath though.  Follow up in the office in two weeks [phone]

## 2017-10-07 NOTE — Anesthesia Procedure Notes (Signed)
Procedure Name: MAC Date/Time: 10/07/2017 11:40 AM Performed by: Kyung Rudd, CRNA Pre-anesthesia Checklist: Patient identified, Emergency Drugs available, Suction available and Patient being monitored Patient Re-evaluated:Patient Re-evaluated prior to induction Oxygen Delivery Method: Simple face mask Preoxygenation: Pre-oxygenation with 100% oxygen Induction Type: IV induction Placement Confirmation: positive ETCO2

## 2017-10-07 NOTE — Anesthesia Procedure Notes (Signed)
Spinal  Patient location during procedure: OR Start time: 10/07/2017 11:33 AM End time: 10/07/2017 11:38 AM Staffing Anesthesiologist: Gaynelle AduFitzgerald, Olinda Nola, MD Performed: anesthesiologist  Preanesthetic Checklist Completed: patient identified, surgical consent, pre-op evaluation, timeout performed, IV checked, risks and benefits discussed and monitors and equipment checked Spinal Block Patient position: sitting Prep: DuraPrep Patient monitoring: cardiac monitor, continuous pulse ox and blood pressure Approach: midline Location: L3-4 Injection technique: single-shot Needle Needle type: Pencan  Needle gauge: 24 G Needle length: 9 cm Assessment Sensory level: T8 Additional Notes Functioning IV was confirmed and monitors were applied. Sterile prep and drape, including hand hygiene and sterile gloves were used. The patient was positioned and the spine was prepped. The skin was anesthetized with lidocaine.  Free flow of clear CSF was obtained prior to injecting local anesthetic into the CSF.  The spinal needle aspirated freely following injection.  The needle was carefully withdrawn.  The patient tolerated the procedure well.

## 2017-10-07 NOTE — Brief Op Note (Signed)
10/07/2017  2:13 PM  PATIENT:  Ronald Atkinson  61 y.o. male  PRE-OPERATIVE DIAGNOSIS:  Right knee osteoarthritis, end stage   POST-OPERATIVE DIAGNOSIS:  Right knee osteoarthritis, end stage  PROCEDURE:  Procedure(s): RIGHT TOTAL KNEE ARTHROPLASTY (Right) DePuy Sigma RP with MBT Revision tray  SURGEON:  Surgeon(s) and Role:    Beverely Low* Angeletta Goelz, MD - Primary  PHYSICIAN ASSISTANT:   ASSISTANTS: Thea Gisthomas B Dixon, PA-C   ANESTHESIA:   regional and spinal  EBL:  50 mL   BLOOD ADMINISTERED:none  DRAINS: none   LOCAL MEDICATIONS USED:  NONE    SPECIMEN:  No Specimen  DISPOSITION OF SPECIMEN:  N/A  COUNTS:  YES  TOURNIQUET:   Total Tourniquet Time Documented: Thigh (Right) - 120 minutes Total: Thigh (Right) - 120 minutes   DICTATION: .Other Dictation: Dictation Number [id number]  PLAN OF CARE: Admit to inpatient   PATIENT DISPOSITION:  PACU - hemodynamically stable.   Delay start of Pharmacological VTE agent (>24hrs) due to surgical blood loss or risk of bleeding: no

## 2017-10-08 LAB — BASIC METABOLIC PANEL
Anion gap: 9 (ref 5–15)
BUN: 20 mg/dL (ref 6–20)
CHLORIDE: 99 mmol/L — AB (ref 101–111)
CO2: 27 mmol/L (ref 22–32)
CREATININE: 1.72 mg/dL — AB (ref 0.61–1.24)
Calcium: 8.6 mg/dL — ABNORMAL LOW (ref 8.9–10.3)
GFR calc Af Amer: 48 mL/min — ABNORMAL LOW (ref >60)
GFR calc non Af Amer: 42 mL/min — ABNORMAL LOW (ref >60)
Glucose, Bld: 211 mg/dL — ABNORMAL HIGH (ref 65–99)
POTASSIUM: 4.8 mmol/L (ref 3.5–5.1)
SODIUM: 135 mmol/L (ref 135–145)

## 2017-10-08 LAB — CBC
HEMATOCRIT: 37.8 % — AB (ref 39.0–52.0)
HEMOGLOBIN: 12.2 g/dL — AB (ref 13.0–17.0)
MCH: 29.2 pg (ref 26.0–34.0)
MCHC: 32.3 g/dL (ref 30.0–36.0)
MCV: 90.4 fL (ref 78.0–100.0)
Platelets: 318 10*3/uL (ref 150–400)
RBC: 4.18 MIL/uL — AB (ref 4.22–5.81)
RDW: 16.1 % — ABNORMAL HIGH (ref 11.5–15.5)
WBC: 13.9 10*3/uL — ABNORMAL HIGH (ref 4.0–10.5)

## 2017-10-08 LAB — GLUCOSE, CAPILLARY
GLUCOSE-CAPILLARY: 128 mg/dL — AB (ref 65–99)
Glucose-Capillary: 209 mg/dL — ABNORMAL HIGH (ref 65–99)
Glucose-Capillary: 126 mg/dL — ABNORMAL HIGH (ref 65–99)
Glucose-Capillary: 109 mg/dL — ABNORMAL HIGH (ref 65–99)

## 2017-10-08 MED ORDER — ACETAMINOPHEN 325 MG PO TABS
650.0000 mg | ORAL_TABLET | ORAL | Status: DC | PRN
  Administered 2017-10-09 – 2017-10-10 (×2): 650 mg via ORAL
  Filled 2017-10-08 (×2): qty 2

## 2017-10-08 MED ORDER — OXYCODONE HCL 5 MG PO TABS
5.0000 mg | ORAL_TABLET | ORAL | Status: DC | PRN
  Administered 2017-10-08 – 2017-10-11 (×13): 15 mg via ORAL
  Filled 2017-10-08 (×13): qty 3

## 2017-10-08 NOTE — Progress Notes (Signed)
Patient ID: Ronald Atkinson, male   DOB: [birth year], 61 y.o.   MRN: [id number] Subjective: 1 Day Post-Op Procedure(s) (LRB): RIGHT TOTAL KNEE ARTHROPLASTY (Right)    Patient reports pain as moderate. No events over night.  CPAP in place this am. Very pleasant dispostion  Objective:   VITALS:   Vitals:   10/07/17 2216 10/08/17 0036  BP:  (!) 157/70  Pulse: 88 79  Resp: 16   Temp:  98 F (36.7 C)  SpO2: 95% 98%    Neurovascular intact Incision: dressing C/D/I - post-op dressing in place with immobilizer on in bed this am  LABS - pending this am No results for input(s): HGB, HCT, WBC, PLT in the last 72 hours.  No results for input(s): NA, K, BUN, CREATININE, GLUCOSE in the last 72 hours.  No results for input(s): LABPT, INR in the last 72 hours.   Assessment/Plan: 1 Day Post-Op Procedure(s) (LRB): RIGHT TOTAL KNEE ARTHROPLASTY (Right)   Advance diet Up with therapy   D/C plan for tomorrow if doing well Dressing change to aquacell in am

## 2017-10-08 NOTE — Clinical Social Work Note (Signed)
CSW acknowledges SNF consult. Plan is for patient to return home with home health.  CSW signing off. Consult again if any other social work needs arise.  Charlynn CourtSarah Pheobe Sandiford, CSW [phone]

## 2017-10-08 NOTE — Progress Notes (Signed)
Pt had placed himself back on CPAP earlier.

## 2017-10-08 NOTE — Progress Notes (Signed)
Physical Therapy Treatment Patient Details Name: Ronald Atkinson G Knudsen MRN: [id number] DOB: [birth year] Today's Date: 10/08/2017    History of Present Illness Pt is a 61 y/o male s/p R TKA. PMH includes DM, anxiety, depression, HTN, ACDF, OSA on CPAP, and R shoulder replacement.     PT Comments    Continuing work on functional mobility and activity tolerance;  Noting very nice progress with knee ROM and gait distance; Plan is for pt to dc home with very minimal assist/supervision from wife (she had a stroke and can't give extensive assist); Recommend HHPT and possibly HHOT follow up   Follow Up Recommendations  DC plan and follow up therapy as arranged by surgeon;Supervision for mobility/OOB     Equipment Recommendations  None recommended by PT    Recommendations for Other Services       Precautions / Restrictions Precautions Precautions: Knee Precaution Booklet Issued: Yes (comment) Precaution Comments: Reviewed supine knee ther ex.  Required Braces or Orthoses: Knee Immobilizer - Right Knee Immobilizer - Right: Other (comment)(until discontinued ) Restrictions RLE Weight Bearing: Weight bearing as tolerated    Mobility  Bed Mobility Overal bed mobility: Needs Assistance Bed Mobility: Supine to Sit     Supine to sit: Min assist     General bed mobility comments: Min handheld A for trunk elevation  during bed mobility.   Transfers Overall transfer level: Needs assistance Equipment used: Rolling walker (2 wheeled) Transfers: Sit to/from Stand Sit to Stand: Min guard         General transfer comment: Minguard for safety; cues for hand placement  Ambulation/Gait Ambulation/Gait assistance: Min guard Ambulation Distance (Feet): 60 Feet Assistive device: Rolling walker (2 wheeled) Gait Pattern/deviations: Step-to pattern;Decreased step length - right;Decreased step length - left;Decreased weight shift to right;Antalgic Gait velocity: Decreased   General Gait Details:  Cues to self-monitor for activity tolerance; cues also to activate quad for stance stability   Stairs            Wheelchair Mobility    Modified Rankin (Stroke Patients Only)       Balance     Sitting balance-Leahy Scale: Good       Standing balance-Leahy Scale: (approaching Fair) Standing balance comment: Reliant on RW for stability                             Cognition Arousal/Alertness: Awake/alert Behavior During Therapy: WFL for tasks assessed/performed Overall Cognitive Status: Within Functional Limits for tasks assessed                                        Exercises Total Joint Exercises Ankle Circles/Pumps: AROM;Both;20 reps Quad Sets: AROM;Right;10 reps Short Arc Quad: AAROM;AROM;Right;10 reps Heel Slides: AAROM;Right;10 reps Straight Leg Raises: AAROM;Right;10 reps Goniometric ROM: approx 3-78 deg    General Comments        Pertinent Vitals/Pain Pain Assessment: 0-10 Pain Score: 8  Pain Location: R knee  Pain Descriptors / Indicators: Aching;Operative site guarding Pain Intervention(s): Premedicated before session    Home Living                      Prior Function            PT Goals (current goals can now be found in the care plan section) Acute Rehab PT Goals Patient Stated  Goal: to decrease pain PT Goal Formulation: With patient Time For Goal Achievement: 10/14/17 Potential to Achieve Goals: Good Progress towards PT goals: Progressing toward goals    Frequency    7X/week      PT Plan Current plan remains appropriate    Co-evaluation              AM-PAC PT "6 Clicks" Daily Activity  Outcome Measure  Difficulty turning over in bed (including adjusting bedclothes, sheets and blankets)?: A Little Difficulty moving from lying on back to sitting on the side of the bed? : Unable Difficulty sitting down on and standing up from a chair with arms (e.g., wheelchair, bedside commode,  etc,.)?: Unable Help needed moving to and from a bed to chair (including a wheelchair)?: A Little Help needed walking in hospital room?: A Little Help needed climbing 3-5 steps with a railing? : A Little 6 Click Score: 14    End of Session Equipment Utilized During Treatment: Gait belt;Right knee immobilizer Activity Tolerance: Patient tolerated treatment well Patient left: in chair;with call bell/phone within reach Nurse Communication: Mobility status PT Visit Diagnosis: Other abnormalities of gait and mobility (R26.89);Pain Pain - Right/Left: Right Pain - part of body: Knee     Time: 7829-56210847-0924 PT Time Calculation (min) (ACUTE ONLY): 37 min  Charges:  $Gait Training: 8-22 mins $Therapeutic Exercise: 8-22 mins                    G Codes:       Van ClinesHolly Miyah Hampshire, PT  Acute Rehabilitation Services Pager [phone] Office [phone]    Levi AlandHolly H Desmond Tufano 10/08/2017, 1:08 PM

## 2017-10-08 NOTE — Evaluation (Signed)
Occupational Therapy Evaluation Patient Details Name: Ronald Atkinson MRN: [id number] DOB: 04/06/1956 Today's Date: 10/08/2017    History of Present Illness Pt is a 61 y/o male s/p R TKA. PMH includes DM, anxiety, depression, HTN, ACDF, OSA on CPAP, and R shoulder replacement.    Clinical Impression   PTA Pt independent in ADL and mobility with SPC PRN for pain. Pt currently set up for UB ADL and mod A for LB ADL. Pt returning to bed with PT at beginning of session, so limited evaluation on functional transfers this session, to monitor with future sessions. Verbally initiated AE education, compensatory strategies for ADL. Pt will benefit from skilled OT in the acute setting as well as afterwards at the SNF level due to decreased activity tolerance, increased required assistance for functional transfers and ADL that wife cannot provide for Pt.     Follow Up Recommendations  SNF;Supervision/Assistance - 24 hour    Equipment Recommendations  Other (comment)(defer to next venue)    Recommendations for Other Services       Precautions / Restrictions Precautions Precautions: Knee Precaution Booklet Issued: Yes (comment) Precaution Comments: previously issued by PT Required Braces or Orthoses: Knee Immobilizer - Right Knee Immobilizer - Right: Other (comment)(until discontinued) Restrictions Weight Bearing Restrictions: Yes RLE Weight Bearing: Weight bearing as tolerated      Mobility Bed Mobility               General bed mobility comments: Pt had just returned to bed with PT  Transfers                 General transfer comment: not assessed this session    Balance                                           ADL either performed or assessed with clinical judgement   ADL Overall ADL's : Needs assistance/impaired Eating/Feeding: Modified independent   Grooming: Wash/dry hands;Wash/dry face;Set up;Bed level   Upper Body Bathing: Minimal  assistance   Lower Body Bathing: Maximal assistance   Upper Body Dressing : Set up   Lower Body Dressing: Maximal assistance Lower Body Dressing Details (indicate cue type and reason): verbally initiated AE education for LB dressing Toilet Transfer: Moderate assistance;Stand-pivot Toilet Transfer Details (indicate cue type and reason): Pt completing transfer with PT as OT entered the room Toileting- Clothing Manipulation and Hygiene: Maximal assistance       Functional mobility during ADLs: (unassessed this session as Pt had just returned to bed)       Vision Patient Visual Report: No change from baseline       Perception     Praxis      Pertinent Vitals/Pain Pain Assessment: 0-10 Pain Score: 8  Pain Location: R knee  Pain Descriptors / Indicators: Aching;Operative site guarding Pain Intervention(s): Limited activity within patient's tolerance;Ice applied     Hand Dominance Right   Extremity/Trunk Assessment Upper Extremity Assessment Upper Extremity Assessment: Overall WFL for tasks assessed   Lower Extremity Assessment Lower Extremity Assessment: Defer to PT evaluation   Cervical / Trunk Assessment Cervical / Trunk Assessment: Normal   Communication Communication Communication: No difficulties   Cognition Arousal/Alertness: Awake/alert Behavior During Therapy: WFL for tasks assessed/performed Overall Cognitive Status: Within Functional Limits for tasks assessed  General Comments: Pt appears to have eyes closed, but listening and answering questions appropriately throught session   General Comments       Exercises     Shoulder Instructions      Home Living Family/patient expects to be discharged to:: Private residence Living Arrangements: Spouse/significant other(recent stroke - limited ability to help) Available Help at Discharge: Family;Available 24 hours/day Type of Home: House Home Access: Level  entry     Home Layout: One level     Bathroom Shower/Tub: Chief Strategy OfficerTub/shower unit   Bathroom Toilet: Standard Bathroom Accessibility: Yes How Accessible: Accessible via walker Home Equipment: Walker - 2 wheels;Bedside commode;Toilet riser;Cane - single point(Pt reports that all DME are big enough for him to use)          Prior Functioning/Environment Level of Independence: Independent with assistive device(s)        Comments: Used cane for ambulation secondary to pain         OT Problem List: Decreased strength;Decreased range of motion;Decreased activity tolerance;Impaired balance (sitting and/or standing);Decreased safety awareness;Decreased knowledge of use of DME or AE;Decreased knowledge of precautions;Pain      OT Treatment/Interventions: Self-care/ADL training;DME and/or AE instruction;Therapeutic activities;Patient/family education;Balance training    OT Goals(Current goals can be found in the care plan section) Acute Rehab OT Goals Patient Stated Goal: to to rehab to get stronger OT Goal Formulation: With patient Time For Goal Achievement: 10/22/17 Potential to Achieve Goals: Good ADL Goals Pt Will Perform Grooming: with supervision;standing Pt Will Perform Upper Body Bathing: with modified independence;sitting Pt Will Perform Lower Body Bathing: with modified independence;with adaptive equipment;sitting/lateral leans Pt Will Transfer to Toilet: with supervision;stand pivot transfer;bedside commode Pt Will Perform Toileting - Clothing Manipulation and hygiene: with min guard assist;sit to/from stand  OT Frequency: Min 2X/week   Barriers to D/C: Decreased caregiver support  Pt's wife had recent stroke and is unable to care for him physically       Co-evaluation              AM-PAC PT "6 Clicks" Daily Activity     Outcome Measure Help from another person eating meals?: None Help from another person taking care of personal grooming?: A Little Help from  another person toileting, which includes using toliet, bedpan, or urinal?: A Lot Help from another person bathing (including washing, rinsing, drying)?: A Lot Help from another person to put on and taking off regular upper body clothing?: None Help from another person to put on and taking off regular lower body clothing?: A Lot 6 Click Score: 17   End of Session CPM Right Knee CPM Right Knee: Off Nurse Communication: Mobility status;Patient requests pain meds(check incision - bleeding noted on ace wrap)  Activity Tolerance: Patient tolerated treatment well Patient left: in bed;with call bell/phone within reach;with bed alarm set;with SCD's reapplied  OT Visit Diagnosis: Unsteadiness on feet (R26.81);Other abnormalities of gait and mobility (R26.89);Pain Pain - Right/Left: Right Pain - part of body: Knee                Time: [id number] OT Time Calculation (min): 18 min Charges:  OT General Charges $OT Visit: 1 Visit OT Evaluation $OT Eval Moderate Complexity: 1 Mod G-Codes:     Sherryl MangesLaura Pranathi Winfree OTR/L [phone]  Evern BioLaura J Syann Cupples 10/08/2017, 5:44 PM

## 2017-10-08 NOTE — Progress Notes (Signed)
Physical Therapy Treatment Patient Details Name: Ronald Atkinson MRN: [id number] DOB: Jan 18, 1956 Today's Date: 10/08/2017    History of Present Illness Pt is a 61 y/o male s/p R TKA. PMH includes DM, anxiety, depression, HTN, ACDF, OSA on CPAP, and R shoulder replacement.     PT Comments    Continuing work on functional mobility and activity tolerance;  This session, Ronald Atkinson had difficulty keeping his balance, with posterior lean evolving into loss of balance posteriorly back into recliner; This brings up safety concerns, as his wife had a stroke, and can't provide physical assist safely; I believe it is worth considering SNF for rehab post this acute stay; Ronald Atkinson is open to that option   Follow Up Recommendations  SNF;Supervision/Assistance - 24 hour     Equipment Recommendations  Rolling walker with 5" wheels;3in1 (PT);Other (comment)(Wide)    Recommendations for Other Services OT consult     Precautions / Restrictions Precautions Precautions: Knee Precaution Booklet Issued: Yes (comment) Precaution Comments: previously issued by PT Required Braces or Orthoses: Knee Immobilizer - Right Knee Immobilizer - Right: Other (comment)(until discontinued) Restrictions Weight Bearing Restrictions: Yes RLE Weight Bearing: Weight bearing as tolerated    Mobility  Bed Mobility Overal bed mobility: Needs Assistance Bed Mobility: Sit to Supine       Sit to supine: Min assist   General bed mobility comments: Cues for technqiue; min assist for RLE  Transfers Overall transfer level: Needs assistance Equipment used: Rolling walker (2 wheeled) Transfers: Sit to/from Stand Sit to Stand: Mod assist         General transfer comment: Light mod assist to power up from recliner; cues for hand placement and technqiue  Ambulation/Gait Ambulation/Gait assistance: Mod assist Ambulation Distance (Feet): 3 Feet(then pivotal steps from recliner to bed) Assistive device: Rolling  walker (2 wheeled) Gait Pattern/deviations: Step-to pattern;Decreased step length - right;Decreased step length - left;Decreased weight shift to right;Antalgic     General Gait Details: Cues to self-monitor for activity tolerance; noted tending to posterior lean with initial attempt to walk back to bed; lost balance backwards back to sitting in recliner; then moved the recliner close to the bed for pivot steps reclienr to bed   Stairs            Wheelchair Mobility    Modified Rankin (Stroke Patients Only)       Balance                                            Cognition Arousal/Alertness: Awake/alert Behavior During Therapy: WFL for tasks assessed/performed Overall Cognitive Status: Within Functional Limits for tasks assessed                                 General Comments: Pt appears to have eyes closed, but listening and answering questions appropriately throught session      Exercises      General Comments General comments (skin integrity, edema, etc.): Noted some drainage from surgical wound on surface of ACE bandage      Pertinent Vitals/Pain Pain Assessment: 0-10 Pain Score: 9  Pain Location: R knee  Pain Descriptors / Indicators: Aching;Operative site guarding Pain Intervention(s): Monitored during session    Home Living Family/patient expects to be discharged to:: Private residence Living Arrangements: Spouse/significant other(recent stroke -  limited ability to help) Available Help at Discharge: Family;Available 24 hours/day Type of Home: House Home Access: Level entry   Home Layout: One level Home Equipment: Walker - 2 wheels;Bedside commode;Toilet riser;Cane - single point(Pt reports that all DME are big enough for him to use)      Prior Function Level of Independence: Independent with assistive device(s)      Comments: Used cane for ambulation secondary to pain    PT Goals (current goals can now be found in  the care plan section) Acute Rehab PT Goals Patient Stated Goal: walk without pain PT Goal Formulation: With patient Time For Goal Achievement: 10/14/17 Potential to Achieve Goals: Good Progress towards PT goals: Not progressing toward goals - comment(Difficulty with balance this session)    Frequency    7X/week      PT Plan Discharge plan needs to be updated    Co-evaluation              AM-PAC PT "6 Clicks" Daily Activity  Outcome Measure  Difficulty turning over in bed (including adjusting bedclothes, sheets and blankets)?: A Little Difficulty moving from lying on back to sitting on the side of the bed? : Unable Difficulty sitting down on and standing up from a chair with arms (e.g., wheelchair, bedside commode, etc,.)?: Unable Help needed moving to and from a bed to chair (including a wheelchair)?: A Lot Help needed walking in hospital room?: A Lot Help needed climbing 3-5 steps with a railing? : A Lot 6 Click Score: 11    End of Session Equipment Utilized During Treatment: Gait belt;Right knee immobilizer Activity Tolerance: Patient tolerated treatment well Patient left: in bed;with call bell/phone within reach;Other (comment)(with OT) Nurse Communication: Mobility status PT Visit Diagnosis: Other abnormalities of gait and mobility (R26.89);Pain Pain - Right/Left: Right Pain - part of body: Knee     Time: [id number] PT Time Calculation (min) (ACUTE ONLY): 17 min  Charges:  $Therapeutic Activity: 8-22 mins                    G Codes:       Van ClinesHolly Nikala Walsworth, PT  Acute Rehabilitation Services Pager [phone] Office [phone]    Levi AlandHolly H Tamer Baughman 10/08/2017, 6:00 PM

## 2017-10-09 ENCOUNTER — Encounter (HOSPITAL_COMMUNITY): Payer: Self-pay | Admitting: Orthopedic Surgery

## 2017-10-09 LAB — CBC
HCT: 32.4 % — ABNORMAL LOW (ref 39.0–52.0)
Hemoglobin: 10.6 g/dL — ABNORMAL LOW (ref 13.0–17.0)
MCH: 29.1 pg (ref 26.0–34.0)
MCHC: 32.7 g/dL (ref 30.0–36.0)
MCV: 89 fL (ref 78.0–100.0)
PLATELETS: 295 10*3/uL (ref 150–400)
RBC: 3.64 MIL/uL — ABNORMAL LOW (ref 4.22–5.81)
RDW: 16.1 % — AB (ref 11.5–15.5)
WBC: 13.4 10*3/uL — AB (ref 4.0–10.5)

## 2017-10-09 LAB — GLUCOSE, CAPILLARY
GLUCOSE-CAPILLARY: 135 mg/dL — AB (ref 65–99)
Glucose-Capillary: 157 mg/dL — ABNORMAL HIGH (ref 65–99)
Glucose-Capillary: 113 mg/dL — ABNORMAL HIGH (ref 65–99)
Glucose-Capillary: 60 mg/dL — ABNORMAL LOW (ref 65–99)
Glucose-Capillary: 94 mg/dL (ref 65–99)

## 2017-10-09 NOTE — Progress Notes (Signed)
Physical Therapy Treatment Patient Details Name: Ronald Atkinson MRN: [id number] DOB: 10-May-1956 Today's Date: 10/09/2017    History of Present Illness Pt is a 61 y/o male s/p R TKA. PMH includes DM, anxiety, depression, HTN, ACDF, OSA on CPAP, and R shoulder replacement.     PT Comments    Patient continues to make progress toward mobility goals and tolerated increased gait distance with several standing rest breaks due to fatigue. Pt did demonstrate improved balance this session however cannot tolerate challenges to balance and required increased assistance when stepping backwards. Patient needs to practice stairs and review HEP next session.    Follow Up Recommendations  DC plan and follow up therapy as arranged by surgeon;Supervision/Assistance - 24 hour     Equipment Recommendations  Rolling walker with 5" wheels;3in1 (PT);Other (comment)(Wide)    Recommendations for Other Services       Precautions / Restrictions Precautions Precautions: Knee Precaution Booklet Issued: Yes (comment) Precaution Comments: precautions/positioning reviewed with pt Required Braces or Orthoses: Knee Immobilizer - Right Knee Immobilizer - Right: Other (comment)(until discontinued) Restrictions Weight Bearing Restrictions: Yes RLE Weight Bearing: Weight bearing as tolerated    Mobility  Bed Mobility Overal bed mobility: Needs Assistance Bed Mobility: Supine to Sit     Supine to sit: Min assist     General bed mobility comments: cues for technique; assist to bring R LE to EOB; no use of rails; pt has adjustable bed at home  Transfers Overall transfer level: Needs assistance Equipment used: Rolling walker (2 wheeled) Transfers: Sit to/from Stand Sit to Stand: From elevated surface;Min guard         General transfer comment: min guard for safety; pt without LOB upon standing; cues for safe hand placement  Ambulation/Gait Ambulation/Gait assistance: Min assist;Mod assist Ambulation  Distance (Feet): 180 Feet Assistive device: Rolling walker (2 wheeled) Gait Pattern/deviations: Step-to pattern;Decreased step length - left;Decreased weight shift to right;Antalgic;Decreased stance time - right;Step-through pattern Gait velocity: Decreased   General Gait Details: mod A required when stepping backwards; cues for posture, R heel strike, and increased bilat step lengths; pt shuffling L foot initially but did improve with distance; several standing rest breaks required    Stairs            Wheelchair Mobility    Modified Rankin (Stroke Patients Only)       Balance Overall balance assessment: Needs assistance Sitting-balance support: No upper extremity supported;Feet supported Sitting balance-Leahy Scale: Good     Standing balance support: Bilateral upper extremity supported;During functional activity Standing balance-Leahy Scale: Poor Standing balance comment: Reliant on RW for stability                             Cognition Arousal/Alertness: Awake/alert Behavior During Therapy: WFL for tasks assessed/performed Overall Cognitive Status: Within Functional Limits for tasks assessed                                 General Comments: pt tends to drowse off easily; pt's wife reported that is baseline      Exercises Total Joint Exercises Heel Slides: AAROM;Right;10 reps;Supine Straight Leg Raises: AAROM;Right;10 reps;Supine Goniometric ROM: approx 80 degrees flexion    General Comments General comments (skin integrity, edema, etc.): wife present      Pertinent Vitals/Pain Pain Assessment: Faces Faces Pain Scale: Hurts even more Pain Location: R knee  Pain Descriptors / Indicators: Grimacing;Guarding;Aching;Sore Pain Intervention(s): Monitored during session;Premedicated before session;Limited activity within patient's tolerance;Repositioned;Ice applied    Home Living                      Prior Function             PT Goals (current goals can now be found in the care plan section) Acute Rehab PT Goals Patient Stated Goal: walk without pain PT Goal Formulation: With patient Time For Goal Achievement: 10/14/17 Potential to Achieve Goals: Good Progress towards PT goals: Progressing toward goals    Frequency    7X/week      PT Plan Discharge plan needs to be updated    Co-evaluation              AM-PAC PT "6 Clicks" Daily Activity  Outcome Measure  Difficulty turning over in bed (including adjusting bedclothes, sheets and blankets)?: A Little Difficulty moving from lying on back to sitting on the side of the bed? : Unable Difficulty sitting down on and standing up from a chair with arms (e.g., wheelchair, bedside commode, etc,.)?: Unable Help needed moving to and from a bed to chair (including a wheelchair)?: A Little Help needed walking in hospital room?: A Little Help needed climbing 3-5 steps with a railing? : A Little 6 Click Score: 14    End of Session Equipment Utilized During Treatment: Gait belt;Right knee immobilizer Activity Tolerance: Patient tolerated treatment well Patient left: with call bell/phone within reach;in chair;with family/visitor present Nurse Communication: Mobility status PT Visit Diagnosis: Other abnormalities of gait and mobility (R26.89);Pain Pain - Right/Left: Right Pain - part of body: Knee     Time: 1425-1500 PT Time Calculation (min) (ACUTE ONLY): 35 min  Charges:  $Gait Training: 23-37 mins                    G Codes:       Ronald Atkinson, PTA Pager: [phone]     Ronald Atkinson 10/09/2017, 4:00 PM

## 2017-10-09 NOTE — Progress Notes (Signed)
Orthopedics Progress Note  Subjective: Patient having more pain and some imbalance today with therapy  Objective:  Vitals:   10/08/17 2108 10/09/17 0412  BP:  (!) 153/88  Pulse: 85 100  Resp: 16   Temp:  99.6 F (37.6 C)  SpO2: 98% 94%    General: Awake and alert  Musculoskeletal: R knee dressing saturated and changed to Aquacel, compartments supple. No pain with calf pumps Neurovascularly intact  Lab Results  Component Value Date   WBC 13.4 (H) 10/09/2017   HGB 10.6 (L) 10/09/2017   HCT 32.4 (L) 10/09/2017   MCV 89.0 10/09/2017   PLT 295 10/09/2017       Component Value Date/Time   NA 135 10/08/2017 0549   K 4.8 10/08/2017 0549   CL 99 (L) 10/08/2017 0549   CO2 27 10/08/2017 0549   GLUCOSE 211 (H) 10/08/2017 0549   BUN 20 10/08/2017 0549   CREATININE 1.72 (H) 10/08/2017 0549   CALCIUM 8.6 (L) 10/08/2017 0549   GFRNONAA 42 (L) 10/08/2017 0549   GFRAA 48 (L) 10/08/2017 0549    No results found for: INR, PROTIME  Assessment/Plan: POD #2 s/p Procedure(s): RIGHT TOTAL KNEE ARTHROPLASTY, increased pain and balance issues POD #2 Patient will benefit from an additional day in hospital for supervised therapy and pain management Likely discharge tomorrow after therapy.  Will need home health therapy and support  Almedia BallsSteven R. Ranell PatrickNorris, MD 10/09/2017 9:08 AM

## 2017-10-09 NOTE — Care Management Note (Signed)
Case Management Note  Patient Details  Name: Ronald Atkinson MRN: [id number] Date of Birth: 04/11/1956  Subjective/Objective: Spoke with Wife at bedside, pt will be discharged Home 10/10/2017 with [facility], HHPT/OT has all DME                   Action/Plan: CM will sign off for now but will be available should additional discharge needs arise or disposition change.    Expected Discharge Date:                  Expected Discharge Plan:  Home w Home Health Services  In-House Referral:     Discharge planning Services  CM Consult  Post Acute Care Choice:  Home Health Choice offered to:  Spouse, Patient  DME Arranged:  CPM(has RW and 3in1) DME Agency:  [employer]  HH Arranged:  PT, OT HH Agency:  [facility] (formerly [facility])  Status of Service:  Completed, signed off  If discussed at [employer] Meetings, dates discussed:    Additional Comments:  Ronald Atkinson, Ronald Axel M, RN 10/09/2017, 9:30 AM

## 2017-10-09 NOTE — Plan of Care (Signed)
  Safety: Ability to remain free from injury will improve 10/09/2017 1034 - Progressing by Darrow BussingArcilla, Burley Kopka M, RN

## 2017-10-09 NOTE — Plan of Care (Signed)
  Safety: Ability to remain free from injury will improve 10/09/2017 1034 - Progressing by Darrow BussingArcilla, Yarethzy Croak M, RN   Activity: Ability to avoid complications of mobility impairment will improve 10/09/2017 1036 - Progressing by Darrow BussingArcilla, Kearston Putman M, RN   Clinical Measurements: Postoperative complications will be avoided or minimized 10/09/2017 1036 - Progressing by Darrow BussingArcilla, Duilio Heritage M, RN   Pain Management: Pain level will decrease with appropriate interventions 10/09/2017 1036 - Progressing by Darrow BussingArcilla, Shelaine Frie M, RN

## 2017-10-09 NOTE — Progress Notes (Signed)
Occupational Therapy Treatment Patient Details Name: Ronald Atkinson MRN: [id number] DOB: 07-Jun-1956 Today's Date: 10/09/2017    History of present illness Pt is a 61 y/o male s/p R TKA. PMH includes DM, anxiety, depression, HTN, ACDF, OSA on CPAP, and R shoulder replacement.    OT comments  Introduced and had pt. Return demo of use of A/E for LB ADLs.  Pt. Interested in A/E and feels it will be helpful as his wife is unable to provide physical assistance.  Pt. Limited by 9/10 during session so limited to seated eob.  Will continue to follow acutely. Noted recommendations for short term SNF.   Follow Up Recommendations  SNF;Supervision/Assistance - 24 hour    Equipment Recommendations       Recommendations for Other Services      Precautions / Restrictions Precautions Precautions: Knee Required Braces or Orthoses: Knee Immobilizer - Right Restrictions RLE Weight Bearing: Weight bearing as tolerated       Mobility Bed Mobility Overal bed mobility: Needs Assistance Bed Mobility: Supine to Sit;Sit to Supine     Supine to sit: Min assist Sit to supine: Min assist   General bed mobility comments: Cues for technqiue; min assist for RLE  Transfers                 General transfer comment: no oob secondary to 9/10 pain. pt. sate eob for tx session    Balance                                           ADL either performed or assessed with clinical judgement   ADL Overall ADL's : Needs assistance/impaired             Lower Body Bathing: Cueing for compensatory techniques;With adaptive equipment Lower Body Bathing Details (indicate cue type and reason): reviewed benefits and use of LH sponge for LB bathing     Lower Body Dressing: Minimal assistance;With adaptive equipment;Sitting/lateral leans Lower Body Dressing Details (indicate cue type and reason): pt. able to return demo of us of sock aide and reacher for don/doff socks. also reviewed use  of don/doff underwear and pants                     Vision       Perception     Praxis      Cognition Arousal/Alertness: Awake/alert Behavior During Therapy: WFL for tasks assessed/performed Overall Cognitive Status: Within Functional Limits for tasks assessed                                          Exercises     Shoulder Instructions       General Comments      Pertinent Vitals/ Pain       Pain Assessment: 0-10 Pain Score: 9  Pain Location: R knee Pain Descriptors / Indicators: Aching Pain Intervention(s): Limited activity within patient's tolerance;Monitored during session;Repositioned;Patient requesting pain meds-RN notified;RN gave pain meds during session  Home Living                                          Prior Functioning/Environment  Frequency  Min 2X/week        Progress Toward Goals  OT Goals(current goals can now be found in the care plan section)  Progress towards OT goals: Progressing toward goals     Plan      Co-evaluation                 AM-PAC PT "6 Clicks" Daily Activity     Outcome Measure   Help from another person eating meals?: None Help from another person taking care of personal grooming?: A Little Help from another person toileting, which includes using toliet, bedpan, or urinal?: A Lot Help from another person bathing (including washing, rinsing, drying)?: A Lot Help from another person to put on and taking off regular upper body clothing?: None Help from another person to put on and taking off regular lower body clothing?: A Lot 6 Click Score: 17    End of Session Equipment Utilized During Treatment: Other (comment);Right knee immobilizer(R KI)  OT Visit Diagnosis: Unsteadiness on feet (R26.81);Other abnormalities of gait and mobility (R26.89);Pain Pain - Right/Left: Right Pain - part of body: Knee   Activity Tolerance     Patient Left in bed;with  call bell/phone within reach;with nursing/sitter in room;with family/visitor present   Nurse Communication Mobility status;Patient requests pain meds(IV stating occluded and would not stop beeping)        Time: [id number] OT Time Calculation (min): 18 min  Charges: OT General Charges $OT Visit: 1 Visit OT Treatments $Self Care/Home Management : 8-22 mins   Ronald Atkinson, Ronald Atkinson, COTA/L 10/09/2017, 8:15 AM

## 2017-10-09 NOTE — Progress Notes (Signed)
Physical Therapy Treatment Patient Details Name: Ronald Atkinson MRN: [id number] DOB: 1956/04/09 Today's Date: 10/09/2017    History of Present Illness Pt is a 61 y/o male s/p R TKA. PMH includes DM, anxiety, depression, HTN, ACDF, OSA on CPAP, and R shoulder replacement.     PT Comments    Patient tolerated increased ambulatory distance this session however does continue to have difficulty with L foot clearance and relies heavily on RW for support. Pt demonstrated LOB X2 during session once posterior and once forward as he began pushing the RW too far forward. HEP next session. Continue to progress as tolerated.    Follow Up Recommendations  SNF;Supervision/Assistance - 24 hour     Equipment Recommendations  Rolling walker with 5" wheels;3in1 (PT);Other (comment)(Wide)    Recommendations for Other Services       Precautions / Restrictions Precautions Precautions: Knee Precaution Booklet Issued: Yes (comment) Precaution Comments: precautions/positioning reviewed with pt Required Braces or Orthoses: Knee Immobilizer - Right Knee Immobilizer - Right: Other (comment)(until discontinued) Restrictions Weight Bearing Restrictions: Yes RLE Weight Bearing: Weight bearing as tolerated    Mobility  Bed Mobility Overal bed mobility: Needs Assistance Bed Mobility: Supine to Sit     Supine to sit: Min assist Sit to supine: Min assist   General bed mobility comments: cues for technique; assist to bring R LE to EOB  Transfers Overall transfer level: Needs assistance Equipment used: Rolling walker (2 wheeled) Transfers: Sit to/from Stand Sit to Stand: Min assist;From elevated surface         General transfer comment: assist to power up into standing and cues for safe hand placement  Ambulation/Gait Ambulation/Gait assistance: Mod assist;Min assist Ambulation Distance (Feet): 100 Feet Assistive device: Rolling walker (2 wheeled) Gait Pattern/deviations: Step-to  pattern;Decreased step length - left;Decreased weight shift to right;Antalgic;Decreased stance time - right Gait velocity: Decreased   General Gait Details: cues for R heel strike, posture, and safe proximity to RW; standing rest breaks X4 due to UE fatigue and heavy reliance on RW; pt with posterior and forward LOB requiring increased assist to recover    Stairs            Wheelchair Mobility    Modified Rankin (Stroke Patients Only)       Balance     Sitting balance-Leahy Scale: Good       Standing balance-Leahy Scale: Poor Standing balance comment: Reliant on RW for stability                             Cognition Arousal/Alertness: Awake/alert Behavior During Therapy: WFL for tasks assessed/performed Overall Cognitive Status: Within Functional Limits for tasks assessed                                        Exercises      General Comments        Pertinent Vitals/Pain Pain Assessment: Faces Pain Score: 9  Faces Pain Scale: Hurts little more Pain Location: R knee  Pain Descriptors / Indicators: Grimacing;Guarding;Aching;Sore Pain Intervention(s): Limited activity within patient's tolerance;Monitored during session;Premedicated before session;Repositioned    Home Living                      Prior Function            PT Goals (current goals can  now be found in the care plan section) Acute Rehab PT Goals PT Goal Formulation: With patient Time For Goal Achievement: 10/14/17 Potential to Achieve Goals: Good Progress towards PT goals: Progressing toward goals    Frequency    7X/week      PT Plan Current plan remains appropriate    Co-evaluation              AM-PAC PT "6 Clicks" Daily Activity  Outcome Measure  Difficulty turning over in bed (including adjusting bedclothes, sheets and blankets)?: A Little Difficulty moving from lying on back to sitting on the side of the bed? : Unable Difficulty  sitting down on and standing up from a chair with arms (e.g., wheelchair, bedside commode, etc,.)?: Unable Help needed moving to and from a bed to chair (including a wheelchair)?: A Lot Help needed walking in hospital room?: A Lot Help needed climbing 3-5 steps with a railing? : A Lot 6 Click Score: 11    End of Session Equipment Utilized During Treatment: Gait belt;Right knee immobilizer Activity Tolerance: Patient tolerated treatment well Patient left: with call bell/phone within reach;in chair;with family/visitor present Nurse Communication: Mobility status PT Visit Diagnosis: Other abnormalities of gait and mobility (R26.89);Pain Pain - Right/Left: Right Pain - part of body: Knee     Time: [id number] PT Time Calculation (min) (ACUTE ONLY): 27 min  Charges:  $Gait Training: 8-22 mins $Therapeutic Activity: 8-22 mins                    G Codes:       Ronald Atkinson, PTA Pager: [phone]) [phone]     Ronald Atkinson 10/09/2017, 10:05 AM

## 2017-10-10 LAB — CBC
HEMATOCRIT: 30.9 % — AB (ref 39.0–52.0)
HEMOGLOBIN: 10.3 g/dL — AB (ref 13.0–17.0)
MCH: 29.3 pg (ref 26.0–34.0)
MCHC: 33.3 g/dL (ref 30.0–36.0)
MCV: 87.8 fL (ref 78.0–100.0)
Platelets: 287 10*3/uL (ref 150–400)
RBC: 3.52 MIL/uL — AB (ref 4.22–5.81)
RDW: 15.9 % — ABNORMAL HIGH (ref 11.5–15.5)
WBC: 12.2 10*3/uL — ABNORMAL HIGH (ref 4.0–10.5)

## 2017-10-10 LAB — GLUCOSE, CAPILLARY
GLUCOSE-CAPILLARY: 105 mg/dL — AB (ref 65–99)
GLUCOSE-CAPILLARY: 82 mg/dL (ref 65–99)
GLUCOSE-CAPILLARY: 152 mg/dL — AB (ref 65–99)
Glucose-Capillary: 121 mg/dL — ABNORMAL HIGH (ref 65–99)

## 2017-10-10 MED ORDER — CONTINUOUS BLOOD GLUC SENSOR MISC: 1.0000 | 0 refills | Status: DC

## 2017-10-10 NOTE — Discharge Summary (Signed)
Physician Discharge Summary    Patient ID: Ronald Atkinson MRN: [id number] DOB/AGE: [birth year] 61 y.o.  Admit date: 10/07/2017 Discharge date:  10/10/2017  Procedures:  Procedure(s) (LRB): RIGHT TOTAL KNEE ARTHROPLASTY (Right)  Attending Physician:  Dr. Malon KindleSteven Loida Calamia  Admission Diagnoses:   Right knee primary OA, end staged  Consultants:  Pt ,OT, case management  Discharge Diagnoses:  Active Problems:   Status post total knee replacement, right  Past Medical History:  Diagnosis Date  . Anxiety   . Arthritis   . Depression   . Diabetes mellitus without complication (HCC)    takes pills and insulin  -  dx 2008  . Headache(784.0)    cluster headaches  . Hypertension   . Sleep apnea    tested 2015     PCP: System, Provider Not In   Discharged Condition: good  Hospital Course:  Patient underwent the above stated procedure on 10/07/2017. Patient tolerated the procedure well and brought to the recovery room in good condition and subsequently to the floor. No complications during their hospital stay. No complicating wound issues during the hospital stay. Incision healing well and good early range of motion  Disposition: 01-Home or Self Care with follow up in 2 weeks  Medications: Current Facility-Administered Medications  Medication Dose Route Frequency Provider Last Rate Last Dose  . 0.9 %  sodium chloride infusion   Intravenous Continuous Beverely LowNorris, Oval Moralez, MD 50 mL/hr at 10/08/17 1800    . oxyCODONE (Oxy IR/ROXICODONE) immediate release tablet 5-15 mg  5-15 mg Oral Q4H PRN Beverely LowNorris, Sladen Plancarte, MD   15 mg at 10/10/17 [id number]   And  . acetaminophen (TYLENOL) tablet 650 mg  650 mg Oral Q4H PRN Beverely LowNorris, Kodah Maret, MD   650 mg at 10/09/17 0455  . aspirin chewable tablet 81 mg  81 mg Oral BID Beverely LowNorris, Zymier Rodgers, MD   81 mg at 10/09/17 2245  . bisacodyl (DULCOLAX) EC tablet 5 mg  5 mg Oral BID Beverely LowNorris, Kerrington Greenhalgh, MD   5 mg at 10/09/17 2245  . bisacodyl (DULCOLAX) suppository 10 mg  10 mg Rectal Daily  PRN Beverely LowNorris, Laylonie Marzec, MD      . buPROPion [facility](WELLBUTRIN) tablet 200 mg  200 mg Oral Daily Beverely LowNorris, Arianna Haydon, MD   200 mg at 10/09/17 1005   And  . buPROPion [facility](WELLBUTRIN) tablet 100 mg  100 mg Oral q1800 Beverely LowNorris, Keyonna Comunale, MD   100 mg at 10/09/17 1730  . calcium carbonate (TUMS - dosed in mg elemental calcium) chewable tablet 200-400 mg of elemental calcium  1-2 tablet Oral BID Beverely LowNorris, Ekam Bonebrake, MD   200 mg of elemental calcium at 10/09/17 1005  . docusate sodium (COLACE) capsule 100 mg  100 mg Oral BID Beverely LowNorris, Wallace Cogliano, MD   100 mg at 10/09/17 2245  . ferrous sulfate tablet 325 mg  325 mg Oral TID Arlyn DunningPC Sophi Calligan, MD   325 mg at 10/09/17 1730  . fluticasone (FLONASE) 50 MCG/ACT nasal spray 1 spray  1 spray Each Nare BID PRN Beverely LowNorris, Xzaiver Vayda, MD      . gabapentin (NEURONTIN) capsule 800 mg  800 mg Oral BID Beverely LowNorris, Jadine Brumley, MD   800 mg at 10/09/17 2245  . HYDROmorphone (DILAUDID) injection 1 mg  1 mg Intravenous Q2H PRN Stilwell, Bryson L, PA-C   1 mg at 10/09/17 2354  . insulin aspart (novoLOG) injection 0-20 Units  0-20 Units Subcutaneous TID WC Colvin Caroliixon, Thomas Bradley, PA-C   3 Units at 10/09/17 1338  . insulin aspart (novoLOG) injection 0-5 Units  0-5 Units Subcutaneous QHS Colvin Caroli, PA-C      . insulin aspart (novoLOG) injection 6 Units  6 Units Subcutaneous TID WC Colvin Caroli, PA-C   6 Units at 10/09/17 1338  . insulin aspart protamine- aspart (NOVOLOG MIX 70/30) injection 60 Units  60 Units Subcutaneous BID Juliette Alcide, MD   60 Units at 10/09/17 1733  . loratadine (CLARITIN) tablet 10 mg  10 mg Oral Daily PRN Beverely Low, MD      . lurasidone (LATUDA) tablet 40 mg  40 mg Oral QPM Beverely Low, MD   40 mg at 10/09/17 1730  . magnesium oxide (MAG-OX) tablet 400 mg  400 mg Oral BID Beverely Low, MD   400 mg at 10/09/17 2248  . menthol-cetylpyridinium (CEPACOL) lozenge 3 mg  1 lozenge Oral PRN Beverely Low, MD       Or  . phenol [facility]) mouth spray 1 spray  1 spray Mouth/Throat PRN  Beverely Low, MD      . metFORMIN (GLUCOPHAGE) tablet 1,000 mg  1,000 mg Oral BID WC Beverely Low, MD   1,000 mg at 10/09/17 1730  . methocarbamol (ROBAXIN) tablet 500 mg  500 mg Oral Q6H PRN Beverely Low, MD   500 mg at 10/09/17 1730   Or  . methocarbamol (ROBAXIN) 500 mg in dextrose 5 % 50 mL IVPB  500 mg Intravenous Q6H PRN Beverely Low, MD      . metoCLOPramide (REGLAN) tablet 5-10 mg  5-10 mg Oral Q8H PRN Beverely Low, MD       Or  . metoCLOPramide (REGLAN) injection 5-10 mg  5-10 mg Intravenous Q8H PRN Beverely Low, MD      . multivitamin with minerals tablet 1 tablet  1 tablet Oral Daily Beverely Low, MD   1 tablet at 10/09/17 1004  . ondansetron (ZOFRAN) tablet 4 mg  4 mg Oral Q6H PRN Beverely Low, MD       Or  . ondansetron [facility]) injection 4 mg  4 mg Intravenous Q6H PRN Beverely Low, MD      . polyethylene glycol (MIRALAX / GLYCOLAX) packet 17 g  17 g Oral Daily PRN Beverely Low, MD      . traZODone (DESYREL) tablet 100 mg  100 mg Oral Wendi Maya, MD      . venlafaxine XR [facility]) 24 hr capsule 150 mg  150 mg Oral Q breakfast Beverely Low, MD   150 mg at 10/09/17 1005    Follow-up Information    Beverely Low, MD. Call in 2 week(s).   Specialty:  Orthopedic Surgery Why:  [phone] Contact information: [address] [place] [address] [phone]        [facility].   Specialty:  Home Health Services Why:  A representative from [facility] will contact you to arrange start date and time for your therapy. Contact information: [address] [place] [address] [phone]           Discharge Instructions    Call MD / Call 911   Complete by:  As directed    If you experience chest pain or shortness of breath, CALL 911 and be transported to the hospital emergency room.  If you develope a fever above 101 F, pus (white drainage) or increased drainage or redness at the wound, or calf pain,  call your surgeon's office.   Change dressing   Complete by:  As directed  Change dressing on Thursday with fresh gel bandage, then use other one for three more days then no dressing.   Constipation Prevention   Complete by:  As directed    Drink plenty of fluids.  Prune juice may be helpful.  You may use a stool softener, such as Colace (over the counter) 100 mg twice a day.  Use MiraLax (over the counter) for constipation as needed.   Diet - low sodium heart healthy   Complete by:  As directed    Driving restrictions   Complete by:  As directed    No driving for 2 weeks   Increase activity slowly as tolerated   Complete by:  As directed    TED hose   Complete by:  As directed    Use stockings (TED hose) for 4 weeks on both leg(s).  These prevent blood clots      Allergies as of 10/10/2017      Reactions   Bee Venom Anaphylaxis, Hives   Penicillins Shortness Of Breath, Rash, Other (See Comments)   PATIENT HAS HAD A PCN REACTION WITH IMMEDIATE RASH, FACIAL/TONGUE/THROAT SWELLING, SOB, OR LIGHTHEADEDNESS WITH HYPOTENSION:  #  #  #  YES  #  #  #   Has patient had a PCN reaction causing severe rash involving mucus membranes or skin necrosis:  #  #  #  NO  #  #  #  PATIENT HAS HAD A PCN REACTION THAT REQUIRED HOSPITALIZATION:  #  #  #  YES  #  #  #   Has patient had a PCN reaction occurring within the last 10 years: #  #  #  YES  #  #  #    Morphine Rash      Medication List    TAKE these medications   aspirin 81 MG chewable tablet Commonly known as:  ASPIRIN CHILDRENS Chew 1 tablet (81 mg total) by mouth 2 (two) times daily.   bisacodyl 5 MG EC tablet Commonly known as:  DULCOLAX Take 5 mg by mouth 2 (two) times daily.   buPROPion 100 MG tablet Commonly known as:  WELLBUTRIN Take 100-200 mg 2 (two) times daily by mouth. Takes 2 tablets in the morning and 1 tablet in the afternoon   calcium carbonate 500 MG chewable tablet Commonly known as:  TUMS - dosed in mg elemental  calcium Chew 1-2 tablets 2 (two) times daily by mouth. Depends on stomach pain if takes 1-2 tablets   cyclobenzaprine 10 MG tablet Commonly known as:  FLEXERIL Take 10 mg at bedtime by mouth.   diazepam 5 MG tablet Commonly known as:  VALIUM Take 1 tablet (5 mg total) by mouth every 6 (six) hours as needed for muscle spasms. What changed:  when to take this   DSS 100 MG Caps Take 100 mg by mouth 2 (two) times daily.   fluticasone 50 MCG/ACT nasal spray Commonly known as:  FLONASE Place 1 spray 2 (two) times daily as needed into both nostrils for allergies or rhinitis.   gabapentin 800 MG tablet Commonly known as:  NEURONTIN Take 800 mg by mouth 2 (two) times daily.   LONGEVITY Caps Take 1 each daily by mouth. Healthy Body Star pack 2.0   loratadine 10 MG tablet Commonly known as:  CLARITIN Take 10 mg daily as needed by mouth for allergies.   lurasidone 40 MG Tabs tablet Commonly known as:  LATUDA Take 40 mg every evening by mouth.  Magnesium Oxide 420 MG Tabs Take 420 mg 2 (two) times daily by mouth.   metFORMIN 1000 MG tablet Commonly known as:  GLUCOPHAGE Take 1,000 mg by mouth 2 (two) times daily with a meal.   NOVOLIN 70/30 FLEXPEN [place] Inject 60 Units 2 (two) times daily before a meal into the skin.   oxyCODONE-acetaminophen 10-325 MG tablet Commonly known as:  PERCOCET Take 1 tablet by mouth every 4 (four) hours as needed for pain. What changed:  when to take this   oxyCODONE-acetaminophen 10-325 MG tablet Commonly known as:  PERCOCET Take 1 tablet by mouth every 4 (four) hours as needed for pain. What changed:  You were already taking a medication with the same name, and this prescription was added. Make sure you understand how and when to take each.   sildenafil 100 MG tablet Commonly known as:  VIAGRA Take 50 mg daily as needed by mouth for erectile dysfunction.   traZODone 100 MG tablet Commonly known as:  DESYREL Take 100 mg by mouth at bedtime.    venlafaxine XR 150 MG 24 hr capsule Commonly known as:  EFFEXOR-XR Take 150 mg daily with breakfast by mouth.            Discharge Care Instructions  (From admission, onward)        Start     Ordered   10/10/17 0000  Change dressing    Comments:  Change dressing on Thursday with fresh gel bandage, then use other one for three more days then no dressing.   10/10/17 0718     D/C home Follow up in the office in two weeks with Dr Ranell PatrickNorris   Signed: Christiano Blandon,STEVEN R 10/10/2017, 7:18 AM

## 2017-10-10 NOTE — Progress Notes (Addendum)
Pt had an unwitnessed fall at 1750 this evening in room while trying to ambulate to bathroom without assistance. Pt is a&ox4. Pt was found on floor by RN at 1755. Pt denies pain. Pt stated, " I know I should've called for help to walk but I am going home tomorrow and have to do this by myself." Pt was assisted to standing position by this RN and AJ, NT and ambulated back to bed, 3 side rails up, bed alarm on and call bell within reach. Pt was re-educated on the importance to call for assistance anytime he needs to ambulate while in the hopsital. Pt agreed and stated "I promise I won't try to get up without calling for help first." Pt's wife had stepped out of room during this time to get food, therefore Chair alarm was not activated. On-call [place] [facility] PA, Matt notified regarding fall - no new orders at this time. Vital signs stable post-fall. BP (!) 159/80 (BP Location: Right Arm)   Pulse 100   Temp 99.5 F (37.5 C) (Oral)   Resp 19   Ht 5\' 10"  (1.778 m)   Wt (!) 140.2 kg (309 lb)   SpO2 92%   BMI 44.34 kg/m  Will continue to monitor.

## 2017-10-10 NOTE — Care Management Note (Addendum)
Case Management Note  Patient Details  Name: Ronald Atkinson MRN: [id number] Date of Birth: 13-Aug-1956  Subjective/Objective:     Right TKA               Action/Plan: Discharge Planning: NCM spoke to pt and he has RW and bedside commode at home. Wife at home to assist but limited due to her medical condition. [employer] will deliver CPM today once he arrives home. [facility] to make aware of dc home today. [facility]   [place], [place]  [phone]   [facility]  Expected Discharge Date:  10/10/17               Expected Discharge Plan:  Home w Home Health Services  In-House Referral:  NA  Discharge planning Services  CM Consult  Post Acute Care Choice:  Home Health Choice offered to:  Spouse, Patient  DME Arranged:  CPM(has RW and 3in1) DME Agency:  [employer]  HH Arranged:  PT, OT, aide HH Agency:  [facility]  Status of Service:  Completed, signed off  If discussed at [employer] Meetings, dates discussed:    Additional Comments:  Elliot CousinShavis, Jocee Kissick Ellen, RN 10/10/2017, 10:42 AM

## 2017-10-10 NOTE — Progress Notes (Addendum)
Inpatient Diabetes Program Recommendations  AACE/ADA: New Consensus Statement on Inpatient Glycemic Control (2015)  Target Ranges:  Prepandial:   less than 140 mg/dL      Peak postprandial:   less than 180 mg/dL (1-2 hours)      Critically ill patients:  140 - 180 mg/dL   Lab Results  Component Value Date   GLUCAP 121 (H) 10/10/2017   HGBA1C 9.9 (H) 09/26/2017    Review of Glycemic Control  Inpatient Diabetes Program Recommendations:   Patient would like to participate in a research study with the [facility] sensor. Patient would receive one (10 day) sensor and reader to wear home with diabetes coordinator instructing on how to apply and use of sensor. Call placed to Dr. Dietrich PatesNorris's office regarding order  [phone] for 3 (10 day) libre freestyle sensors on discharge.  [facility] for [facility] Freestyle Libre. Patient has signed and been given copy of consent for CGM/Freestyle [place] research study. MD also notified.  Education done regarding application and changing CGM sensor (alternate every 10 days on back of arms), 12 hour warm-up, use of glucometer/where to buy strips, how to scan CGM for glucose reading and information for PCP. Patient has been given [employer] Lib[employer] reader and first sensor for use. Patient has also been given educational packet regarding use CGM sensor including the 1-800 toll free number for any questions, problems or needs related to the [facility] sensors or reader.  Patient to be given Rx. For sensors with prescriptions/discharge paper work.  Sensor applied by patient to ( L) Arm at (11:15 am).  Explained that glucose readings will not be available until 12 hours after application. Patient understands that Diabetes coordinator will call them 2 times after discharge (between days 7-12 after d/c from hospital and between days 30-25 after d/c from hospital). Patient verbalizes understanding of use of Freestyle Libre CGM and was told that any issues with blood  sugars/diabetes will need to be addressed by PCP.  Diabetes Quality of Life Survey administered to patient.  Please reconcile and cosign prescription for Ronald IgoLibre to verify prescription printing for pt. @ discharge.  Spoke with pt about A1C results 9.9  (average CBG 237 over the past 2-3 months time) and explained what an A1C is, basic pathophysiology of DM Type 2, basic home care, basic diabetes diet nutrition principles, importance of checking CBGs and maintaining good CBG control to prevent long-term and short-term complications. Reviewed signs and symptoms of hyperglycemia and hypoglycemia and how to treat hypoglycemia at home. Also reviewed blood sugar goals at home.   Thank you, Ronald FischerJudy E. Sevin Farone, RN, MSN, CDE  Diabetes Coordinator Inpatient Glycemic Control Team Team Pager [phone] (8am-5pm) 10/10/2017 10:29 AM

## 2017-10-10 NOTE — Progress Notes (Signed)
Physical Therapy Treatment Patient Details Name: Ronald Atkinson MRN: [id number] DOB: 12/26/1955 Today's Date: 10/10/2017    History of Present Illness Pt is a 61 y/o male s/p R TKA. PMH includes DM, anxiety, depression, HTN, ACDF, OSA on CPAP, and R shoulder replacement.     PT Comments    Patient is making gradual progress with gait. Pt does continue to demonstrate difficulty with L foot clearance, decreased activity tolerance, and unsteady at times.  Patient may benefit from another night stay for PT in the am. Continue to progress safety with gait next session. Wife present.   Follow Up Recommendations  DC plan and follow up therapy as arranged by surgeon;Supervision/Assistance - 24 hour     Equipment Recommendations  Rolling walker with 5" wheels;3in1 (PT);Other (comment)(Wide)    Recommendations for Other Services OT consult     Precautions / Restrictions Precautions Precautions: Knee Precaution Booklet Issued: Yes (comment) Precaution Comments: precautions/positioning reviewed with pt Restrictions Weight Bearing Restrictions: Yes RLE Weight Bearing: Weight bearing as tolerated    Mobility  Bed Mobility Overal bed mobility: Needs Assistance Bed Mobility: Supine to Sit     Supine to sit: Min assist     General bed mobility comments: assist to elevate trunk into sitting; cues for technique  Transfers Overall transfer level: Needs assistance Equipment used: Rolling walker (2 wheeled) Transfers: Sit to/from Stand Sit to Stand: From elevated surface;Min assist;Min guard         General transfer comment: min A to power up into standing and for balance from EOB and min guard from [facility]; cues for safe hand placement   Ambulation/Gait Ambulation/Gait assistance: Min assist Ambulation Distance (Feet): 120 Feet Assistive device: Rolling walker (2 wheeled) Gait Pattern/deviations: Step-to pattern;Decreased step length - left;Decreased weight shift to  right;Antalgic;Decreased stance time - right Gait velocity: Decreased   General Gait Details: heavy reliance on UE support; cues for safe use of AD, R heel strike and posture; pt continues to have difficulty with L foot clearance and required several rest breaks due to fatigue; pt without LOB however does remain unsteady at times requiring increased assist   Stairs            Wheelchair Mobility    Modified Rankin (Stroke Patients Only)       Balance Overall balance assessment: Needs assistance Sitting-balance support: Feet supported;Single extremity supported Sitting balance-Leahy Scale: Fair     Standing balance support: Bilateral upper extremity supported;During functional activity Standing balance-Leahy Scale: Poor Standing balance comment: Reliant on RW for stability                             Cognition Arousal/Alertness: Awake/alert Behavior During Therapy: WFL for tasks assessed/performed Overall Cognitive Status: Within Functional Limits for tasks assessed                                 General Comments: pt tends to drowse off easily; pt's wife reported that is baseline      Exercises      General Comments General comments (skin integrity, edema, etc.): pt with drainage coming through bandage--RN aware      Pertinent Vitals/Pain Pain Assessment: 0-10 Pain Score: 9  Pain Location: R knee  Pain Descriptors / Indicators: Grimacing;Sore;Guarding Pain Intervention(s): Limited activity within patient's tolerance;Monitored during session;Premedicated before session;Repositioned    Home Living  Prior Function            PT Goals (current goals can now be found in the care plan section) Acute Rehab PT Goals PT Goal Formulation: With patient Time For Goal Achievement: 10/14/17 Potential to Achieve Goals: Good Progress towards PT goals: Progressing toward goals    Frequency    7X/week       PT Plan Current plan remains appropriate    Co-evaluation              AM-PAC PT "6 Clicks" Daily Activity  Outcome Measure  Difficulty turning over in bed (including adjusting bedclothes, sheets and blankets)?: A Little Difficulty moving from lying on back to sitting on the side of the bed? : Unable Difficulty sitting down on and standing up from a chair with arms (e.g., wheelchair, bedside commode, etc,.)?: Unable Help needed moving to and from a bed to chair (including a wheelchair)?: A Little Help needed walking in hospital room?: A Little Help needed climbing 3-5 steps with a railing? : A Little 6 Click Score: 14    End of Session Equipment Utilized During Treatment: Gait belt Activity Tolerance: Patient limited by fatigue Patient left: with call bell/phone within reach;with family/visitor present;in chair Nurse Communication: Mobility status PT Visit Diagnosis: Other abnormalities of gait and mobility (R26.89);Pain Pain - Right/Left: Right Pain - part of body: Knee     Time: 1400-1443 PT Time Calculation (min) (ACUTE ONLY): 43 min  Charges:  $Gait Training: 23-37 mins $Therapeutic Activity: 8-22 mins                    G Codes:       Ronald Atkinson, PTA Pager: [phone]     Carolynne EdouardKellyn R Berkley Wrightsman 10/10/2017, 4:42 PM

## 2017-10-10 NOTE — Op Note (Signed)
NAME:  Ronald Atkinson, Ronald                    ACCOUNT NO.:  MEDICAL RECORD NO.:  [id number]  LOCATION:                                 FACILITY:  PHYSICIAN:  Ronald Atkinson, M.D.      DATE OF BIRTH:  DATE OF PROCEDURE:  10/07/2017 DATE OF DISCHARGE:                              OPERATIVE REPORT   PREOPERATIVE DIAGNOSIS:  Right knee end-stage osteoarthritis.  POSTOPERATIVE DIAGNOSIS:  Right knee end-stage osteoarthritis.  PROCEDURE PERFORMED:  Right total knee replacement using DePuy Sigma rotating platform prosthesis with MBT revision tray.  ATTENDING SURGEON:  Ronald BallsSteven R. Ranell PatrickNorris, MD.  ASSISTANT:  [place] Charonhomas Brad Dixon, [facility], who was scrubbed during the entire procedure and necessary for satisfactory completion of surgery.  ANESTHESIA:  Spinal anesthesia was used plus adductor canal block.  ESTIMATED BLOOD LOSS:  Minimal.  FLUID REPLACED:  1500 mL of crystalloid.  INSTRUMENT COUNTS:  Correct.  COMPLICATIONS:  There were no complications.  Perioperative antibiotics were given.  Tourniquet was used at 350 mmHg for 1 hour and 30 minutes.  INDICATIONS:  The patient is a 61 year old male with worsening right knee pain and functional loss secondary to end-stage osteoarthritis. The patient has bone-on-bone findings on standing x-rays.  The patient has had progressive pain despite conservative management.  He is not a candidate for arthroscopic debridement and presents now with worsening pain including rest pain and night pain desiring total knee arthroplasty to restore function and eliminate pain to the knee.  Informed consent was obtained.  DESCRIPTION OF PROCEDURE:  After an adequate level of anesthesia was achieved, the patient was positioned supine on the operating room table. The right knee was correctly identified and sterilely prepped and draped in usual manner.  Time-out was called.  We elevated the leg and exsanguinated with an Esmarch bandage and then elevated the  tourniquet to 350 mmHg.  Placed the knee in flexion.  Longitudinal midline incision was created with a #10 blade scalpel, dissection down through subcutaneous tissues.  Medial parapatellar arthrotomy was created with a fresh #10 blade scalpel.  Lateral patellofemoral ligaments divided. Patella were everted.  Distal femur entered with a step-cut drill followed by placement of intramedullary resection guide resecting 10 mm of bone off the distal femur set on 5 degrees right.  Once the distal femoral cut was made, we sized the femur size 4 anterior down. Performed our anterior, posterior, and chamfer cuts with the 4-in-1 block.  We removed the ACL, PCL, meniscal tissue, subluxed the tibia anteriorly, and performed our tibial cut 90 degrees perpendicular to the long axis of the tibia.  We had about 2 degrees posterior slope for the MBT revision tray.  The patient's body mass index required placement of the revision tray for extra tibial stability.  After completion of our tibial cut, we sized the tibia to a size 5 and then completed our tibial preparation with a modular drill and keel punch.  Once we completed our tibial preparation and our trial in place, then we used the box cut guide for the posterior cruciate box cut using oscillating saw.  We then placed our 4 right femur in place and  then a 10-mm poly insert and placed the knee in extension.  We felt that we could probably get a 12.5 in for the real.  We then resurfaced the patella going from a 27-mm thickness down to a 17-mm thickness.  We drilled lugs for a 41 patella, placed our 41 patellar button in place and then ranged the knee with normal patellar tracking with no-touch technique.  At this point, we removed all trial components, pulse irrigated the knee, dried thoroughly, and then vacuum mixed DePuy high-viscosity cement on the back table.  We then placed the components in place with the cement, reduced the knee with a size 4, 10  poly trial and placed the knee in extension while cement hardened and then used a patellar clamp for the patella.  Once the cement was hardened, we removed excess cement with 0.25-inch curved osteotome and tonsil pickups.  At this point, thorough inspection of the remainder of the knee to make sure there was no other debris present, either bony debris, soft tissue debris, or cement.  We then selected the real 4, 12.5 poly and placed that in place of the trial, reduced the knee, had nice little pop with that medial joint reduction, very stable knee both in flexion and extension, normal patellar tracking.  Flexion was beyond 120 degrees with the patella reduced.  At this point, we irrigated thoroughly and then closed the parapatellar arthrotomy with #1 Vicryl suture followed by 2-0 Vicryl for subcutaneous closure and 4-0 Monocryl for skin.  Steri-Strips applied followed by a sterile dressing.  The patient tolerated surgery well.     Ronald Atkinson, M.D.     SRN/MEDQ  D:  10/07/2017  T:  10/08/2017  Job:  [id number]

## 2017-10-10 NOTE — Plan of Care (Signed)
  Progressing Safety: Ability to remain free from injury will improve 10/10/2017 1117 - Progressing by Quentin CornwallMadison, Marivel Mcclarty, RN Education: Knowledge of the prescribed therapeutic regimen will improve 10/10/2017 1117 - Progressing by Quentin CornwallMadison, Saskia Simerson, RN Activity: Ability to avoid complications of mobility impairment will improve 10/10/2017 1117 - Progressing by Quentin CornwallMadison, Jerimiah Wolman, RN Range of joint motion will improve 10/10/2017 1117 - Progressing by Quentin CornwallMadison, Caedence Snowden, RN Clinical Measurements: Postoperative complications will be avoided or minimized 10/10/2017 1117 - Progressing by Quentin CornwallMadison, Alajiah Dutkiewicz, RN Pain Management: Pain level will decrease with appropriate interventions 10/10/2017 1117 - Progressing by Quentin CornwallMadison, Atalaya Zappia, RN Skin Integrity: Signs of wound healing will improve 10/10/2017 1117 - Progressing by Quentin CornwallMadison, Adyn Serna, RN

## 2017-10-10 NOTE — Progress Notes (Signed)
Physical Therapy Treatment Patient Details Name: Ronald Atkinson MRN: [id number] DOB: 1956/01/27 Today's Date: 10/10/2017    History of Present Illness Pt is a [age] y/o male s/p R TKA. PMH includes DM, anxiety, depression, HTN, ACDF, OSA on CPAP, and R shoulder replacement.     PT Comments    Patient was led through HEP this session and required min/mod A to achieve available ROM. Focus on progressing gait next session. Wife present.    Follow Up Recommendations  DC plan and follow up therapy as arranged by surgeon;Supervision/Assistance - 24 hour     Equipment Recommendations  Rolling walker with 5" wheels;3in1 (PT);Other (comment)(Wide)    Recommendations for Other Services OT consult     Precautions / Restrictions Precautions Precautions: Knee Precaution Booklet Issued: Yes (comment) Precaution Comments: precautions/positioning reviewed with pt Required Braces or Orthoses: Knee Immobilizer - Right Knee Immobilizer - Right: Other (comment)(until discontinued) Restrictions Weight Bearing Restrictions: Yes RLE Weight Bearing: Weight bearing as tolerated    Mobility  Bed Mobility Overal bed mobility: Needs Assistance Bed Mobility: Supine to Sit     Supine to sit: Min guard     General bed mobility comments: cues for technique; min guard for safety  Transfers Overall transfer level: Needs assistance Equipment used: Rolling walker (2 wheeled) Transfers: Sit to/from Stand Sit to Stand: From elevated surface;Min guard         General transfer comment: cues for safe hand placement; min guard for safety  Ambulation/Gait Ambulation/Gait assistance: Min assist Ambulation Distance (Feet): 10 Feet Assistive device: Rolling walker (2 wheeled) Gait Pattern/deviations: Step-to pattern;Decreased step length - left;Decreased weight shift to right;Antalgic;Decreased stance time - right Gait velocity: Decreased   General Gait Details: cues for R heel strike and step length  symmetry; pt with poor L foot clearance   Stairs            Wheelchair Mobility    Modified Rankin (Stroke Patients Only)       Balance Overall balance assessment: Needs assistance Sitting-balance support: No upper extremity supported;Feet supported Sitting balance-Leahy Scale: Good     Standing balance support: Bilateral upper extremity supported;During functional activity Standing balance-Leahy Scale: Poor Standing balance comment: Reliant on RW for stability                             Cognition Arousal/Alertness: Awake/alert Behavior During Therapy: WFL for tasks assessed/performed Overall Cognitive Status: Within Functional Limits for tasks assessed                                 General Comments: pt tends to drowse off easily; pt's wife reported that is baseline      Exercises Total Joint Exercises Ankle Circles/Pumps: AROM;Both;10 reps Quad Sets: AROM;Right;10 reps Short Arc Quad: AAROM;AROM;Right;10 reps Heel Slides: AAROM;Right;10 reps;Supine Hip ABduction/ADduction: AROM;Right;10 reps;Supine Straight Leg Raises: AAROM;Right;10 reps;Supine Long Arc Quad: AAROM;Right;10 reps;Seated Knee Flexion: AROM;Right;5 reps;Seated;Other (comment)(10 sec holds)    General Comments        Pertinent Vitals/Pain Pain Assessment: Faces Faces Pain Scale: Hurts even more Pain Location: R knee  Pain Descriptors / Indicators: Grimacing;Aching;Sore;Burning;Guarding Pain Intervention(s): Limited activity within patient's tolerance;Monitored during session;Premedicated before session;Repositioned    Home Living                      Prior Function  PT Goals (current goals can now be found in the care plan section) Acute Rehab PT Goals Patient Stated Goal: walk without pain PT Goal Formulation: With patient Time For Goal Achievement: 10/14/17 Potential to Achieve Goals: Good Progress towards PT goals: Progressing  toward goals    Frequency    7X/week      PT Plan Current plan remains appropriate    Co-evaluation              AM-PAC PT "6 Clicks" Daily Activity  Outcome Measure  Difficulty turning over in bed (including adjusting bedclothes, sheets and blankets)?: A Little Difficulty moving from lying on back to sitting on the side of the bed? : Unable Difficulty sitting down on and standing up from a chair with arms (e.g., wheelchair, bedside commode, etc,.)?: Unable Help needed moving to and from a bed to chair (including a wheelchair)?: A Little Help needed walking in hospital room?: A Little Help needed climbing 3-5 steps with a railing? : A Little 6 Click Score: 14    End of Session Equipment Utilized During Treatment: Gait belt Activity Tolerance: Patient tolerated treatment well Patient left: with call bell/phone within reach;with family/visitor present;Other (comment)(pt on commode ) Nurse Communication: Mobility status PT Visit Diagnosis: Other abnormalities of gait and mobility (R26.89);Pain Pain - Right/Left: Right Pain - part of body: Knee     Time: [id number] PT Time Calculation (min) (ACUTE ONLY): 46 min  Charges:  $Gait Training: 8-22 mins $Therapeutic Exercise: 23-37 mins                    G Codes:       Ronald Atkinson, PTA Pager: [phone]     Ronald Atkinson 10/10/2017, 9:30 AM

## 2017-10-10 NOTE — Progress Notes (Signed)
Orthopedics Progress Note  Subjective: Stable overnight. No complaints this AM.  Objective:  Vitals:   10/09/17 2156 10/10/17 0000  BP: (!) 170/81   Pulse: 96   Resp: 15   Temp: 99.5 F (37.5 C)   SpO2: 92% 94%    General: Awake and alert  Musculoskeletal: right knee dressing clean and dry and intact, no cords Neurovascularly intact  Lab Results  Component Value Date   WBC 12.2 (H) 10/10/2017   HGB 10.3 (L) 10/10/2017   HCT 30.9 (L) 10/10/2017   MCV 87.8 10/10/2017   PLT 287 10/10/2017       Component Value Date/Time   NA 135 10/08/2017 0549   K 4.8 10/08/2017 0549   CL 99 (L) 10/08/2017 0549   CO2 27 10/08/2017 0549   GLUCOSE 211 (H) 10/08/2017 0549   BUN 20 10/08/2017 0549   CREATININE 1.72 (H) 10/08/2017 0549   CALCIUM 8.6 (L) 10/08/2017 0549   GFRNONAA 42 (L) 10/08/2017 0549   GFRAA 48 (L) 10/08/2017 0549    No results found for: INR, PROTIME  Assessment/Plan: POD #3 s/p Procedure(s): RIGHT TOTAL KNEE ARTHROPLASTY D/C home after bandage change to Aquacel and therapy Follow up in two weeks in the office  Viviann SpareSteven R. Ranell PatrickNorris, MD 10/10/2017 7:15 AM

## 2017-10-11 ENCOUNTER — Encounter (HOSPITAL_COMMUNITY): Payer: Self-pay | Admitting: Internal Medicine

## 2017-10-11 ENCOUNTER — Inpatient Hospital Stay (HOSPITAL_COMMUNITY): Payer: Non-veteran care

## 2017-10-11 ENCOUNTER — Inpatient Hospital Stay (HOSPITAL_COMMUNITY): Payer: Non-veteran care | Admitting: Anesthesiology

## 2017-10-11 ENCOUNTER — Encounter (HOSPITAL_COMMUNITY)
Admission: RE | Disposition: A | Discharge status: Home Health | Payer: Self-pay | Source: Ambulatory Visit | Attending: Orthopedic Surgery

## 2017-10-11 DIAGNOSIS — G9340 Encephalopathy, unspecified: Secondary | ICD-10-CM | POA: Diagnosis not present

## 2017-10-11 DIAGNOSIS — N179 Acute kidney failure, unspecified: Secondary | ICD-10-CM | POA: Diagnosis present

## 2017-10-11 DIAGNOSIS — E119 Type 2 diabetes mellitus without complications: Secondary | ICD-10-CM

## 2017-10-11 DIAGNOSIS — E871 Hypo-osmolality and hyponatremia: Secondary | ICD-10-CM | POA: Diagnosis present

## 2017-10-11 DIAGNOSIS — I10 Essential (primary) hypertension: Secondary | ICD-10-CM | POA: Diagnosis present

## 2017-10-11 DIAGNOSIS — F419 Anxiety disorder, unspecified: Secondary | ICD-10-CM | POA: Diagnosis present

## 2017-10-11 HISTORY — PX: I & D EXTREMITY: SHX5045

## 2017-10-11 LAB — BASIC METABOLIC PANEL
ANION GAP: 8 (ref 5–15)
BUN: 19 mg/dL (ref 6–20)
CHLORIDE: 100 mmol/L — AB (ref 101–111)
CO2: 23 mmol/L (ref 22–32)
Calcium: 8.2 mg/dL — ABNORMAL LOW (ref 8.9–10.3)
Creatinine, Ser: 1.59 mg/dL — ABNORMAL HIGH (ref 0.61–1.24)
GFR calc Af Amer: 53 mL/min — ABNORMAL LOW (ref >60)
GFR calc non Af Amer: 46 mL/min — ABNORMAL LOW (ref >60)
GLUCOSE: 177 mg/dL — AB (ref 65–99)
POTASSIUM: 3.8 mmol/L (ref 3.5–5.1)
Sodium: 131 mmol/L — ABNORMAL LOW (ref 135–145)

## 2017-10-11 LAB — URINALYSIS, ROUTINE W REFLEX MICROSCOPIC
BILIRUBIN URINE: NEGATIVE
Glucose, UA: 50 mg/dL — AB
Ketones, ur: NEGATIVE mg/dL
LEUKOCYTES UA: NEGATIVE
NITRITE: NEGATIVE
PH: 5 (ref 5.0–8.0)
Protein, ur: >=300 mg/dL — AB
SPECIFIC GRAVITY, URINE: 1.012 (ref 1.005–1.030)

## 2017-10-11 LAB — CBC WITH DIFFERENTIAL/PLATELET
BASOS PCT: 0 %
Basophils Absolute: 0 10*3/uL (ref 0.0–0.1)
EOS ABS: 0.2 10*3/uL (ref 0.0–0.7)
EOS PCT: 2 %
HCT: 29.8 % — ABNORMAL LOW (ref 39.0–52.0)
HEMOGLOBIN: 10.2 g/dL — AB (ref 13.0–17.0)
LYMPHS ABS: 1.4 10*3/uL (ref 0.7–4.0)
Lymphocytes Relative: 10 %
MCH: 29.7 pg (ref 26.0–34.0)
MCHC: 34.2 g/dL (ref 30.0–36.0)
MCV: 86.6 fL (ref 78.0–100.0)
MONOS PCT: 15 %
Monocytes Absolute: 2.1 10*3/uL — ABNORMAL HIGH (ref 0.1–1.0)
NEUTROS PCT: 73 %
Neutro Abs: 10.1 10*3/uL — ABNORMAL HIGH (ref 1.7–7.7)
PLATELETS: 277 10*3/uL (ref 150–400)
RBC: 3.44 MIL/uL — ABNORMAL LOW (ref 4.22–5.81)
RDW: 15.7 % — ABNORMAL HIGH (ref 11.5–15.5)
WBC: 13.8 10*3/uL — AB (ref 4.0–10.5)

## 2017-10-11 LAB — BLOOD GAS, ARTERIAL
ACID-BASE EXCESS: 0.2 mmol/L (ref 0.0–2.0)
BICARBONATE: 24.6 mmol/L (ref 20.0–28.0)
FIO2: 0.21
O2 Saturation: 87.7 %
PCO2 ART: 42.3 mmHg (ref 32.0–48.0)
PH ART: 7.383 (ref 7.350–7.450)
Patient temperature: 98.9
pO2, Arterial: 60.2 mmHg — ABNORMAL LOW (ref 83.0–108.0)

## 2017-10-11 LAB — HEMOGLOBIN A1C
HEMOGLOBIN A1C: 9.4 % — AB (ref 4.8–5.6)
MEAN PLASMA GLUCOSE: 223.08 mg/dL

## 2017-10-11 LAB — HEPATIC FUNCTION PANEL
ALK PHOS: 74 U/L (ref 38–126)
ALT: 29 U/L (ref 17–63)
AST: 31 U/L (ref 15–41)
Albumin: 2.3 g/dL — ABNORMAL LOW (ref 3.5–5.0)
BILIRUBIN DIRECT: 0.2 mg/dL (ref 0.1–0.5)
BILIRUBIN INDIRECT: 0.3 mg/dL (ref 0.3–0.9)
TOTAL PROTEIN: 6.5 g/dL (ref 6.5–8.1)
Total Bilirubin: 0.5 mg/dL (ref 0.3–1.2)

## 2017-10-11 LAB — GLUCOSE, CAPILLARY
GLUCOSE-CAPILLARY: 132 mg/dL — AB (ref 65–99)
Glucose-Capillary: 78 mg/dL (ref 65–99)
Glucose-Capillary: 170 mg/dL — ABNORMAL HIGH (ref 65–99)
Glucose-Capillary: 173 mg/dL — ABNORMAL HIGH (ref 65–99)
Glucose-Capillary: 220 mg/dL — ABNORMAL HIGH (ref 65–99)

## 2017-10-11 SURGERY — IRRIGATION AND DEBRIDEMENT EXTREMITY
Anesthesia: General | Site: Knee | Laterality: Right

## 2017-10-11 MED ORDER — 0.9 % SODIUM CHLORIDE (POUR BTL) OPTIME
TOPICAL | Status: DC | PRN
  Administered 2017-10-11: 1000 mL

## 2017-10-11 MED ORDER — FENTANYL CITRATE (PF) 100 MCG/2ML IJ SOLN
INTRAMUSCULAR | Status: DC | PRN
  Administered 2017-10-11: 50 ug via INTRAVENOUS

## 2017-10-11 MED ORDER — FENTANYL CITRATE (PF) 250 MCG/5ML IJ SOLN
INTRAMUSCULAR | Status: AC
  Filled 2017-10-11: qty 5

## 2017-10-11 MED ORDER — LIDOCAINE 2% (20 MG/ML) 5 ML SYRINGE
INTRAMUSCULAR | Status: AC
  Filled 2017-10-11: qty 5

## 2017-10-11 MED ORDER — METOCLOPRAMIDE HCL 5 MG/ML IJ SOLN: 5.0000 mg | Freq: Three times a day (TID) | INTRAMUSCULAR | Status: DC | PRN

## 2017-10-11 MED ORDER — GABAPENTIN 400 MG PO CAPS
400.0000 mg | ORAL_CAPSULE | Freq: Two times a day (BID) | ORAL | Status: DC
  Administered 2017-10-11 – 2017-10-12 (×2): 400 mg via ORAL
  Filled 2017-10-11 (×2): qty 1

## 2017-10-11 MED ORDER — PROPOFOL 10 MG/ML IV BOLUS
INTRAVENOUS | Status: DC | PRN
  Administered 2017-10-11: 200 mg via INTRAVENOUS

## 2017-10-11 MED ORDER — PROPOFOL 10 MG/ML IV BOLUS
INTRAVENOUS | Status: AC
  Filled 2017-10-11: qty 20

## 2017-10-11 MED ORDER — CLINDAMYCIN PHOSPHATE 900 MG/50ML IV SOLN
INTRAVENOUS | Status: AC
  Filled 2017-10-11: qty 50

## 2017-10-11 MED ORDER — SODIUM CHLORIDE 0.9 % IV SOLN
INTRAVENOUS | Status: DC
  Administered 2017-10-11 – 2017-10-12 (×2): via INTRAVENOUS

## 2017-10-11 MED ORDER — ONDANSETRON HCL 4 MG PO TABS: 4.0000 mg | ORAL_TABLET | Freq: Four times a day (QID) | ORAL | Status: DC | PRN

## 2017-10-11 MED ORDER — FENTANYL CITRATE (PF) 100 MCG/2ML IJ SOLN
INTRAMUSCULAR | Status: AC
  Filled 2017-10-11: qty 2

## 2017-10-11 MED ORDER — ACETAMINOPHEN 325 MG PO TABS: 650.0000 mg | ORAL_TABLET | ORAL | Status: DC | PRN

## 2017-10-11 MED ORDER — OXYCODONE HCL 5 MG PO TABS: 5.0000 mg | ORAL_TABLET | Freq: Four times a day (QID) | ORAL | Status: DC | PRN

## 2017-10-11 MED ORDER — MIDAZOLAM HCL 2 MG/2ML IJ SOLN
INTRAMUSCULAR | Status: AC
  Filled 2017-10-11: qty 2

## 2017-10-11 MED ORDER — METOCLOPRAMIDE HCL 5 MG PO TABS: 5.0000 mg | ORAL_TABLET | Freq: Three times a day (TID) | ORAL | Status: DC | PRN

## 2017-10-11 MED ORDER — SUCCINYLCHOLINE CHLORIDE 200 MG/10ML IV SOSY
PREFILLED_SYRINGE | INTRAVENOUS | Status: AC
  Filled 2017-10-11: qty 10

## 2017-10-11 MED ORDER — HYDROCODONE-ACETAMINOPHEN 5-325 MG PO TABS
1.0000 | ORAL_TABLET | ORAL | Status: DC | PRN
  Administered 2017-10-11 – 2017-10-12 (×3): 1 via ORAL
  Filled 2017-10-11 (×3): qty 1

## 2017-10-11 MED ORDER — DOCUSATE SODIUM 100 MG PO CAPS: 100.0000 mg | ORAL_CAPSULE | Freq: Two times a day (BID) | ORAL | Status: DC

## 2017-10-11 MED ORDER — CLINDAMYCIN PHOSPHATE 900 MG/50ML IV SOLN
INTRAVENOUS | Status: DC | PRN
  Administered 2017-10-11: 900 mg via INTRAVENOUS

## 2017-10-11 MED ORDER — ACETAMINOPHEN 650 MG RE SUPP: 650.0000 mg | RECTAL | Status: DC | PRN

## 2017-10-11 MED ORDER — SODIUM CHLORIDE 0.9 % IV SOLN
INTRAVENOUS | Status: AC
  Administered 2017-10-11: 11:00:00 via INTRAVENOUS

## 2017-10-11 MED ORDER — LABETALOL HCL 5 MG/ML IV SOLN
INTRAVENOUS | Status: AC
  Filled 2017-10-11: qty 4

## 2017-10-11 MED ORDER — CLINDAMYCIN PHOSPHATE 600 MG/50ML IV SOLN
600.0000 mg | Freq: Three times a day (TID) | INTRAVENOUS | Status: DC
  Administered 2017-10-12 (×2): 600 mg via INTRAVENOUS
  Filled 2017-10-11 (×4): qty 50

## 2017-10-11 MED ORDER — PHENOL 1.4 % MT LIQD: 1.0000 | OROMUCOSAL | Status: DC | PRN

## 2017-10-11 MED ORDER — POLYETHYLENE GLYCOL 3350 17 G PO PACK: 17.0000 g | PACK | Freq: Every day | ORAL | Status: DC | PRN

## 2017-10-11 MED ORDER — ONDANSETRON HCL 4 MG/2ML IJ SOLN: 4.0000 mg | Freq: Four times a day (QID) | INTRAMUSCULAR | Status: DC | PRN

## 2017-10-11 MED ORDER — LIDOCAINE HCL (CARDIAC) 20 MG/ML IV SOLN
INTRAVENOUS | Status: DC | PRN
  Administered 2017-10-11: 100 mg via INTRAVENOUS

## 2017-10-11 MED ORDER — FENTANYL CITRATE (PF) 100 MCG/2ML IJ SOLN
25.0000 ug | INTRAMUSCULAR | Status: DC | PRN
  Administered 2017-10-11: 50 ug via INTRAVENOUS

## 2017-10-11 MED ORDER — SODIUM CHLORIDE 0.9 % IR SOLN
Status: DC | PRN
  Administered 2017-10-11: 3000 mL

## 2017-10-11 MED ORDER — ONDANSETRON HCL 4 MG/2ML IJ SOLN
INTRAMUSCULAR | Status: AC
  Filled 2017-10-11: qty 2

## 2017-10-11 MED ORDER — PROMETHAZINE HCL 25 MG/ML IJ SOLN: 6.2500 mg | INTRAMUSCULAR | Status: DC | PRN

## 2017-10-11 MED ORDER — LABETALOL HCL 5 MG/ML IV SOLN
10.0000 mg | Freq: Once | INTRAVENOUS | Status: AC
  Administered 2017-10-11: 10 mg via INTRAVENOUS

## 2017-10-11 MED ORDER — MENTHOL 3 MG MT LOZG: 1.0000 | LOZENGE | OROMUCOSAL | Status: DC | PRN

## 2017-10-11 SURGICAL SUPPLY — 58 items
BANDAGE ACE 4X5 VEL STRL LF (GAUZE/BANDAGES/DRESSINGS) ×1 IMPLANT
BNDG CMPR MED 10X6 ELC LF (GAUZE/BANDAGES/DRESSINGS) ×1
BNDG COHESIVE 4X5 TAN STRL (GAUZE/BANDAGES/DRESSINGS) ×3 IMPLANT
BNDG ELASTIC 6X10 VLCR STRL LF (GAUZE/BANDAGES/DRESSINGS) ×2 IMPLANT
BNDG GAUZE ELAST 4 BULKY (GAUZE/BANDAGES/DRESSINGS) ×9 IMPLANT
BRUSH SCRUB SURG 4.25 DISP (MISCELLANEOUS) ×1 IMPLANT
COVER SURGICAL LIGHT HANDLE (MISCELLANEOUS) ×3 IMPLANT
CUFF TOURNIQUET SINGLE 18IN (TOURNIQUET CUFF) ×1 IMPLANT
CUFF TOURNIQUET SINGLE 24IN (TOURNIQUET CUFF) IMPLANT
CUFF TOURNIQUET SINGLE 34IN LL (TOURNIQUET CUFF) IMPLANT
DRAPE IMP U-DRAPE 54X76 (DRAPES) ×3 IMPLANT
DRAPE U-SHAPE 47X51 STRL (DRAPES) ×3 IMPLANT
DRSG ADAPTIC 3X8 NADH LF (GAUZE/BANDAGES/DRESSINGS) ×3 IMPLANT
DRSG PAD ABDOMINAL 8X10 ST (GAUZE/BANDAGES/DRESSINGS) ×6 IMPLANT
DURAPREP 26ML APPLICATOR (WOUND CARE) ×3 IMPLANT
ELECT REM PT RETURN 9FT ADLT (ELECTROSURGICAL)
ELECTRODE REM PT RTRN 9FT ADLT (ELECTROSURGICAL) IMPLANT
FACESHIELD WRAPAROUND (MASK) IMPLANT
FACESHIELD WRAPAROUND OR TEAM (MASK) ×1 IMPLANT
GAUZE SPONGE 4X4 12PLY STRL (GAUZE/BANDAGES/DRESSINGS) ×3 IMPLANT
GLOVE BIOGEL PI ORTHO PRO 7.5 (GLOVE) ×2
GLOVE BIOGEL PI ORTHO PRO SZ8 (GLOVE) ×2
GLOVE ORTHO TXT STRL SZ7.5 (GLOVE) ×3 IMPLANT
GLOVE PI ORTHO PRO STRL 7.5 (GLOVE) ×1 IMPLANT
GLOVE PI ORTHO PRO STRL SZ8 (GLOVE) ×1 IMPLANT
GLOVE SURG ORTHO 8.5 STRL (GLOVE) ×3 IMPLANT
GOWN STRL REUS W/ TWL LRG LVL3 (GOWN DISPOSABLE) ×1 IMPLANT
GOWN STRL REUS W/ TWL XL LVL3 (GOWN DISPOSABLE) ×2 IMPLANT
GOWN STRL REUS W/TWL LRG LVL3 (GOWN DISPOSABLE) ×3
GOWN STRL REUS W/TWL XL LVL3 (GOWN DISPOSABLE) ×6
HANDPIECE INTERPULSE COAX TIP (DISPOSABLE)
KIT BASIN OR (CUSTOM PROCEDURE TRAY) ×3 IMPLANT
KIT ROOM TURNOVER OR (KITS) ×3 IMPLANT
MANIFOLD NEPTUNE II (INSTRUMENTS) ×3 IMPLANT
NS IRRIG 1000ML POUR BTL (IV SOLUTION) ×3 IMPLANT
PACK ORTHO EXTREMITY (CUSTOM PROCEDURE TRAY) ×3 IMPLANT
PAD ABD 8X10 STRL (GAUZE/BANDAGES/DRESSINGS) IMPLANT
PAD ARMBOARD 7.5X6 YLW CONV (MISCELLANEOUS) ×6 IMPLANT
PENCIL BUTTON HOLSTER BLD 10FT (ELECTRODE) ×2 IMPLANT
SET CYSTO W/LG BORE CLAMP LF (SET/KITS/TRAYS/PACK) ×2 IMPLANT
SET HNDPC FAN SPRY TIP SCT (DISPOSABLE) IMPLANT
SPONGE LAP 18X18 X RAY DECT (DISPOSABLE) ×3 IMPLANT
SPONGE LAP 4X18 X RAY DECT (DISPOSABLE) ×1 IMPLANT
STAPLER VISISTAT 35W (STAPLE) ×4 IMPLANT
STOCKINETTE IMPERVIOUS 9X36 MD (GAUZE/BANDAGES/DRESSINGS) ×3 IMPLANT
SUT ETHILON 2 0 FS 18 (SUTURE) IMPLANT
SUT VIC AB 0 CT1 27 (SUTURE) ×3
SUT VIC AB 0 CT1 27XBRD ANBCTR (SUTURE) IMPLANT
SUT VIC AB 2-0 CT1 27 (SUTURE) ×6
SUT VIC AB 2-0 CT1 TAPERPNT 27 (SUTURE) IMPLANT
SWAB CULTURE ESWAB REG 1ML (MISCELLANEOUS) IMPLANT
TOWEL OR 17X24 6PK STRL BLUE (TOWEL DISPOSABLE) ×3 IMPLANT
TOWEL OR 17X26 10 PK STRL BLUE (TOWEL DISPOSABLE) ×3 IMPLANT
TUBE CONNECTING 12'X1/4 (SUCTIONS) ×1
TUBE CONNECTING 12X1/4 (SUCTIONS) ×2 IMPLANT
UNDERPAD 30X30 (UNDERPADS AND DIAPERS) ×1 IMPLANT
WATER STERILE IRR 1000ML POUR (IV SOLUTION) ×1 IMPLANT
YANKAUER SUCT BULB TIP NO VENT (SUCTIONS) ×3 IMPLANT

## 2017-10-11 NOTE — Transfer of Care (Signed)
Immediate Anesthesia Transfer of Care Note  Patient: Ronald Atkinson  Procedure(s) Performed: IRRIGATION AND DEBRIDEMENT RIGHT KNEE WITH WOUND CLOSURE (Right Knee)  Patient Location: PACU  Anesthesia Type:General  Level of Consciousness: awake, alert , oriented and patient cooperative  Airway & Oxygen Therapy: Patient Spontanous Breathing and Patient connected to face mask oxygen  Post-op Assessment: Report given to RN, Post -op Vital signs reviewed and stable, Patient moving all extremities and Patient moving all extremities X 4  Post vital signs: Reviewed and stable  Last Vitals:  Vitals:   10/11/17 1041 10/11/17 1335  BP: (!) 159/72 (!) 162/80  Pulse: 92 94  Resp: 16   Temp: 37.2 C 36.9 C  SpO2: 93% 94%    Last Pain:  Vitals:   10/11/17 1335  TempSrc: Oral  PainSc:       Patients Stated Pain Goal: 3 (10/07/17 1500)  Complications: No apparent anesthesia complications

## 2017-10-11 NOTE — Progress Notes (Signed)
Orthopedic Tech Progress Note Patient Details:  Ronald Atkinson Feb 26, 1956 [id number]      Post Interventions Patient Tolerated: Well Instructions Provided: Care of device   [name] 10/11/2017, 8:08 AM Trapeze bar patient helper

## 2017-10-11 NOTE — Progress Notes (Signed)
Orthopedic Tech Progress Note Patient Details:  Ronald Atkinson 04-Sep-1956 [id number]  CPM Right Knee CPM Right Knee: Off Right Knee Flexion (Degrees): 60 Right Knee Extension (Degrees): 0 Additional Comments: Pt unable to use CPM at this time/ pt schedule for surgery.(pt is due for surgery.)  Post Interventions Patient Tolerated: Well Instructions Provided: Care of device  Alvina ChouWilliams, Yvetta Drotar C 10/11/2017, 1:03 PM

## 2017-10-11 NOTE — Brief Op Note (Signed)
10/11/2017  4:38 PM  PATIENT:  Curlene LabrumEdward G Vandeberg  61 y.o. male  PRE-OPERATIVE DIAGNOSIS:  STATUS POST TOTAL KNEE REPLACEMENT, PERSISTENT BLEEDING  POST-OPERATIVE DIAGNOSIS:  STATUS POST TOTAL KNEE REPLACEMENT, PERSISTENT BLEEDING  PROCEDURE:  Procedure(s): IRRIGATION AND DEBRIDEMENT RIGHT KNEE WITH WOUND CLOSURE (Right)  SURGEON:  Surgeon(s) and Role:    Beverely Low* Farheen Pfahler, MD - Primary  PHYSICIAN ASSISTANT:   ASSISTANTS: Thea Gisthomas B Dixon, PA-C   ANESTHESIA:   general  EBL:  10 mL   BLOOD ADMINISTERED:none  DRAINS: none   LOCAL MEDICATIONS USED:  NONE  SPECIMEN:  No Specimen  DISPOSITION OF SPECIMEN:  N/A  COUNTS:  YES  TOURNIQUET:  * No tourniquets in log *  DICTATION: .Other Dictation: Dictation Number [id number]  PLAN OF CARE: Admit to inpatient   PATIENT DISPOSITION:  PACU - hemodynamically stable.   Delay start of Pharmacological VTE agent (>24hrs) due to surgical blood loss or risk of bleeding: no

## 2017-10-11 NOTE — Progress Notes (Signed)
Orthopedics Progress Note  Subjective: Patient feeling poorly, low energy and balance issues and still concerned about bleeding from the leg  Objective:  Vitals:   10/10/17 2348 10/11/17 0448  BP:  (!) 150/78  Pulse:  98  Resp: 18 18  Temp:  97.6 F (36.4 C)  SpO2: 94% 93%    General: Awake and alert  Musculoskeletal: still with dark blood oozing from the inferior aspect of the incision on the leg, calf supple Neurovascularly intact  Lab Results  Component Value Date   WBC 13.8 (H) 10/11/2017   HGB 10.2 (L) 10/11/2017   HCT 29.8 (L) 10/11/2017   MCV 86.6 10/11/2017   PLT 277 10/11/2017       Component Value Date/Time   NA 131 (L) 10/11/2017 0813   K 3.8 10/11/2017 0813   CL 100 (L) 10/11/2017 0813   CO2 23 10/11/2017 0813   GLUCOSE 177 (H) 10/11/2017 0813   BUN 19 10/11/2017 0813   CREATININE 1.59 (H) 10/11/2017 0813   CALCIUM 8.2 (L) 10/11/2017 0813   GFRNONAA 46 (L) 10/11/2017 0813   GFRAA 53 (L) 10/11/2017 0813    No results found for: INR, PROTIME  Assessment/Plan: POD #4  s/p Procedure(s): RIGHT TOTAL KNEE ARTHROPLASTY Still with bleeding from his incision site.  May need to I+D the incision and re-repair later today. Will ask internal medicine to see patient for managing comorbidities.  Almedia BallsSteven R. Ranell PatrickNorris, MD 10/11/2017 9:14 AM

## 2017-10-11 NOTE — Consult Note (Signed)
[facility] Medical Consultation  Ronald Atkinson ZOX:[id number] DOB: 20-Aug-1956 DOA: 10/07/2017 PCP: [facility], Ronald Atkinson   Requesting physician: Ronald Atkinson  Date of consultation: 09/11/17 Reason for consultation: diabetes control hyponatremia  Impression/Recommendations Principal Problem:   Acute encephalopathy Active Problems:   Status post total knee replacement, right   Hypertension   Diabetes mellitus without complication (HCC)   Anxiety   Hyponatremia   AKI (acute kidney injury) (HCC)  #1. Acute encephalopathy. Patient found to be quite lethargic. Etiology unclear. Mild leukocytosis afebrile hemodynamically stable. No metabolic derangements. Hg 10.2 down from 12.6 pre-operatively.  Neuro exam intact when patient is awake. Concern for hypoventilation obesity syndrome. Patient with a history of obstructive sleep apnea. oxygen saturation level 93 on C Pap last night. He did receive Robaxin and narcotics 9 AM. Chart review does not indicate excessive meds -Obtain ABG -Decreased narcotics -Check urinalysis -IV fluids -Monitor oxygen saturation level -Oxygen supplementation as indicated -if no improvement consider CT head  #2. Hyponatremia. Mild. Sodium level 131 this a.m. but trending down. Patient with decreased oral intake over the last 2 days. -Gentle IV fluids -Monitor intake and output -Recheck in the morning  #3. Diabetes type 2. Poor control in the hospital. Home regimen includes oral agents and 70/30 40 units twice a day. She was provided with meal coverage postop. Capillary blood sugar range 78-178. -We'll discontinue meal coverage for now as patient is nothing by mouth and his appetite has been unreliable lately -Continue 70/30 at current dose -Diabetes coordinator consult. Consider discontinuing oral agents for now?  #4. Hypertension. Fair control postoperatively.  -Monitor  #5. Osteoarthritis status post knee replacement 4 days ago. Site bleeding -Back to  OR per orthopedics  #6. AKI. Seems to have chronic component as last documented creatinine WNL 2015. Currently creatinine 1.5 which is improving.  -hold nephrotoxins -gentle IV fluids -monitor urine output -recheck in am  TR[facility] will followup again tomorrow. Please contact can be of assistance in the meanwhile. Thank you for this consultation.  Chief Complaint: lethargy  HPI:  Ronald Atkinson is a very pleasant 61 year old past medical history that includes hypertension, diabetes type 2, anxiety, obesity, sleep apnea on C Pap at night, osteoarthritis end-stage of the knee. He was 4 days postop. [facility] are asked to consult for management of medical issues.  Information is obtained from the chart and the patient and his wife is at the bedside. Patient's up in chair states that his pain is managed "fairly well". He states initially he was eating well after surgery but the last 2 days he has "not had any appetite". Last night needed go the restroom and was convinced he could manage on his own got out of bed and fell on the floor. He states he checks his blood sugar "occasionally". His home regimen includes 70/30 40 units twice a day. He denies any meal coverage. Reports feeling slightly fatigued having difficulty staying awake. He complies with C Pap during the night. He denies fever chills headache dizziness syncope or near-syncope. He denies abdominal pain nausea vomiting diarrhea constipation. Denies dysuria hematuria frequency or urgency. In discussion with physical therapists she indicates that the last 2 days he has been quite lethargic to the point he is unable to purchase a plate in therapy.   Review of Systems:  10 point review of systems complete and all systems are negative except as indicated in the history of present illness  Past Medical History:  Diagnosis Date  . Anxiety   .  Arthritis   . Depression   . Diabetes mellitus without complication (HCC)    takes pills and insulin   -  dx 2008  . Headache(784.0)    cluster headaches  . Hypertension   . Sleep apnea    tested 2015   Past Surgical History:  Procedure Laterality Date  . ANTERIOR CERVICAL DECOMP/DISCECTOMY FUSION N/A 08/12/2014   Procedure: Cervical four/five anterior cervical decompression with fusion interbody prosthesis plating and bonegraft with removal of old plate;  Surgeon: Tressie Stalker, MD;  Location: [facility];  Service: Neurosurgery;  Laterality: N/A;  . CARPAL TUNNEL RELEASE     both wrists  . EYE SURGERY     bil cataracts  . JOINT REPLACEMENT     right shoulder  . TOTAL KNEE ARTHROPLASTY Right 10/07/2017   Procedure: RIGHT TOTAL KNEE ARTHROPLASTY;  Surgeon: Beverely Low, MD;  Location: [facility] OR;  Service: Orthopedics;  Laterality: Right;  . ULNAR NERVE REPAIR     right shoulder and neck   Social History:  reports that  has never smoked. he has never used smokeless tobacco. He reports that he does not drink alcohol or use drugs.  Allergies  Allergen Reactions  . Bee Venom Anaphylaxis and Hives  . Penicillins Shortness Of Breath, Rash and Other (See Comments)    PATIENT HAS HAD A PCN REACTION WITH IMMEDIATE RASH, FACIAL/TONGUE/THROAT SWELLING, SOB, OR LIGHTHEADEDNESS WITH HYPOTENSION:  #  #  #  YES  #  #  #   Has patient had a PCN reaction causing severe rash involving mucus membranes or skin necrosis:  #  #  #  NO  #  #  #  PATIENT HAS HAD A PCN REACTION THAT REQUIRED HOSPITALIZATION:  #  #  #  YES  #  #  #   Has patient had a PCN reaction occurring within the last 10 years: #  #  #  YES  #  #  #    . Morphine Rash   History reviewed. No pertinent family history.  Prior to Admission medications   Medication Sig Start Date End Date Taking? Authorizing Provider  buPROPion (WELLBUTRIN) 100 MG tablet Take 100-200 mg 2 (two) times daily by mouth. Takes 2 tablets in the morning and 1 tablet in the afternoon   Yes [provider]  calcium carbonate (TUMS - DOSED IN MG ELEMENTAL  CALCIUM) 500 MG chewable tablet Chew 1-2 tablets 2 (two) times daily by mouth. Depends on stomach pain if takes 1-2 tablets   Yes [provider]  cyclobenzaprine (FLEXERIL) 10 MG tablet Take 10 mg at bedtime by mouth.   Yes [provider]  diazepam (VALIUM) 5 MG tablet Take 1 tablet (5 mg total) by mouth every 6 (six) hours as needed for muscle spasms. Patient taking differently: Take 5 mg every 6 (six) hours by mouth.  08/13/14  Yes Tressie Stalker, MD  fluticasone [facility]) 50 MCG/ACT nasal spray Place 1 spray 2 (two) times daily as needed into both nostrils for allergies or rhinitis.   Yes [provider]  gabapentin (NEURONTIN) 800 MG tablet Take 800 mg by mouth 2 (two) times daily.   Yes [provider]  Insulin NPH Isophane & Regular (NOVOLIN 70/30 FLEXPEN ) Inject 60 Units 2 (two) times daily before a meal into the skin.   Yes [provider]  loratadine (CLARITIN) 10 MG tablet Take 10 mg daily as needed by mouth for allergies.   Yes [provider]  lurasidone (LATUDA) 40 MG TABS tablet Take 40 mg every evening by mouth.   Yes [provider]  Magnesium Oxide 420 MG TABS Take 420 mg 2 (two) times daily by mouth.   Yes [provider]  metFORMIN (GLUCOPHAGE) 1000 MG tablet Take 1,000 mg by mouth 2 (two) times daily with a meal.   Yes [provider]  oxyCODONE-acetaminophen (PERCOCET) 10-325 MG per tablet Take 1 tablet by mouth every 4 (four) hours as needed for pain. Patient taking differently: Take 1 tablet daily as needed by mouth for pain.  08/13/14  Yes Tressie Stalker, MD  Specialty Vitamins Products (LONGEVITY) CAPS Take 1 each daily by mouth. Healthy Body Star pack 2.0   Yes [provider]  venlafaxine XR (EFFEXOR-XR) 150 MG 24 hr capsule Take 150 mg daily with breakfast by mouth.   Yes [provider]  aspirin (ASPIRIN CHILDRENS) 81 MG chewable tablet Chew 1 tablet (81 mg total) by  mouth 2 (two) times daily. 10/07/17   Beverely Low, MD  bisacodyl (DULCOLAX) 5 MG EC tablet Take 5 mg by mouth 2 (two) times daily.    [provider]  Continuous Blood Gluc Sensor MISC 1 each by Does not apply route as directed. Use as directed every 10 days. May dispense FreeStyle [employer] or similar. 10/10/17   Dixon, Katharina Caper, PA-C  docusate sodium 100 MG CAPS Take 100 mg by mouth 2 (two) times daily. Patient not taking: Reported on 09/22/2017 08/13/14   Tressie Stalker, MD  oxyCODONE-acetaminophen (PERCOCET) 10-325 MG tablet Take 1 tablet by mouth every 4 (four) hours as needed for pain. 10/07/17   Beverely Low, MD  sildenafil (VIAGRA) 100 MG tablet Take 50 mg daily as needed by mouth for erectile dysfunction.    [provider]  traZODone (DESYREL) 100 MG tablet Take 100 mg by mouth at bedtime.    [provider]   Physical Exam: Blood pressure (!) 159/72, pulse 92, temperature 98.9 F (37.2 C), temperature source Oral, resp. rate 16, height 5\' 10"  (1.778 m), weight (!) 140.2 kg (309 lb), SpO2 93 %. Vitals:   10/11/17 0448 10/11/17 1041  BP: (!) 150/78 (!) 159/72  Pulse: 98 92  Resp: 18 16  Temp: 97.6 F (36.4 C) 98.9 F (37.2 C)  SpO2: 93% 93%     General:  Sitting up in chair obese quite lethargic responds to verbal stimuli  Eyes: Pupils equal round reactive to light EOMI no scleral icterus  ENT: Ears clear nose without drainage oropharynx without erythema or exudate. Mucous membranes slightly dry  Neck: Supple no JVD full range of motion  Cardiovascular: Regular rate and rhythm no murmur gallop or rub no lower extremity edema  Respiratory: Regular rate somewhat shallow breath sounds distant hear no crackles no wheezes  Abdomen: Obese positive bowel sounds no guarding or rebounding  Skin: Dressing to right knee intact otherwise no rash or lesions  Musculoskeletal: Joints without swelling/erythema right knee with dressing  intact  Psychiatric: Somewhat lethargic but cooperative  Neurologic: Oriented to self and place slightly lethargic will respond to verbal stimuli when not stimulated.  Labs on Admission:  Basic Metabolic Panel: Recent Labs  Lab 10/08/17 0549 10/11/17 0813  NA 135 131*  K 4.8 3.8  CL 99* 100*  CO2 27 23  GLUCOSE 211* 177*  BUN 20 19  CREATININE 1.72* 1.59*  CALCIUM 8.6* 8.2*   Liver Function Tests: No results for input(s): AST, ALT,  ALKPHOS, BILITOT, PROT, ALBUMIN in the last 168 hours. No results for input(s): LIPASE, AMYLASE in the last 168 hours. No results for input(s): AMMONIA in the last 168 hours. CBC: Recent Labs  Lab 10/08/17 0549 10/09/17 0524 10/10/17 0538 10/11/17 0813  WBC 13.9* 13.4* 12.2* 13.8*  NEUTROABS  --   --   --  10.1*  HGB 12.2* 10.6* 10.3* 10.2*  HCT 37.8* 32.4* 30.9* 29.8*  MCV 90.4 89.0 87.8 86.6  PLT 318 295 287 277   Cardiac Enzymes: No results for input(s): CKTOTAL, CKMB, CKMBINDEX, TROPONINI in the last 168 hours. BNP: Invalid input(s): POCBNP CBG: Recent Labs  Lab 10/10/17 1139 10/10/17 1613 10/10/17 2141 10/11/17 0633 10/11/17 0849  GLUCAP 105* 82 152* 78 170*    Radiological Exams on Admission: No results found.  EKG:   Time spent: 70 minutes  Hca Houston Healthcare SoutheastBLACK,KAREN M [facility]   If 7PM-7AM, please contact night-coverage [email or web address] Password [email or web address] 10/11/2017, 11:51 AM

## 2017-10-11 NOTE — Progress Notes (Signed)
Physical Therapy Treatment Patient Details Name: Ronald Atkinson MRN: [id number] DOB: Aug 05, 1956 Today's Date: 10/11/2017    History of Present Illness Pt is a 61 y/o male s/p R TKA. PMH includes DM, anxiety, depression, HTN, ACDF, OSA on CPAP, and R shoulder replacement.     PT Comments    Patient tolerated ambulating 16520ft this session with min guard assist and no LOB. Min A for bed mobility. Continue to progress as tolerated.    Follow Up Recommendations  DC plan and follow up therapy as arranged by surgeon;Supervision/Assistance - 24 hour;SNF     Equipment Recommendations  Rolling walker with 5" wheels;3in1 (PT);Other (comment)(Wide)    Recommendations for Other Services OT consult     Precautions / Restrictions Precautions Precautions: Knee Precaution Booklet Issued: Yes (comment) Precaution Comments: precautions/positioning reviewed with pt Restrictions Weight Bearing Restrictions: Yes RLE Weight Bearing: Weight bearing as tolerated    Mobility  Bed Mobility Overal bed mobility: Needs Assistance Bed Mobility: Supine to Sit;Sit to Supine     Supine to sit: Min assist Sit to supine: Min guard   General bed mobility comments: cues for sequencing and technique; assist to elevate trunk into sitting; increased time and effort  Transfers Overall transfer level: Needs assistance Equipment used: Rolling walker (2 wheeled) Transfers: Sit to/from Stand Sit to Stand: From elevated surface;Min assist         General transfer comment: assist to steady when transitioning hand placement; cues for safe hand placement  Ambulation/Gait Ambulation/Gait assistance: Min assist Ambulation Distance (Feet): 120 Feet Assistive device: Rolling walker (2 wheeled) Gait Pattern/deviations: Decreased step length - left;Decreased weight shift to right;Antalgic;Decreased stance time - right;Step-through pattern;Decreased step length - right   Gait velocity interpretation: Below normal  speed for age/gender General Gait Details: cues for step length symmetry, increased stride length, increased cadence, and posture; pt is tolerating R LE weight bearing better this session and demonstrated safer use of AD   Stairs            Wheelchair Mobility    Modified Rankin (Stroke Patients Only)       Balance Overall balance assessment: Needs assistance Sitting-balance support: Feet supported;Bilateral upper extremity supported Sitting balance-Leahy Scale: Fair     Standing balance support: Bilateral upper extremity supported;During functional activity Standing balance-Leahy Scale: Poor Standing balance comment: Reliant on RW for stability                             Cognition Arousal/Alertness: Awake/alert Behavior During Therapy: WFL for tasks assessed/performed Overall Cognitive Status: Within Functional Limits for tasks assessed                                 General Comments: pt more alert this session      Exercises      General Comments        Pertinent Vitals/Pain Pain Assessment: Faces Faces Pain Scale: Hurts a little bit Pain Location: R knee  Pain Descriptors / Indicators: Guarding Pain Intervention(s): Monitored during session;Premedicated before session;Repositioned    Home Living                      Prior Function            PT Goals (current goals can now be found in the care plan section) Acute Rehab PT Goals PT Goal Formulation:  With patient Time For Goal Achievement: 10/14/17 Potential to Achieve Goals: Good Progress towards PT goals: Progressing toward goals    Frequency    7X/week      PT Plan Current plan remains appropriate    Co-evaluation              AM-PAC PT "6 Clicks" Daily Activity  Outcome Measure  Difficulty turning over in bed (including adjusting bedclothes, sheets and blankets)?: Unable Difficulty moving from lying on back to sitting on the side of the  bed? : Unable Difficulty sitting down on and standing up from a chair with arms (e.g., wheelchair, bedside commode, etc,.)?: Unable Help needed moving to and from a bed to chair (including a wheelchair)?: A Little Help needed walking in hospital room?: A Little Help needed climbing 3-5 steps with a railing? : A Lot 6 Click Score: 11    End of Session Equipment Utilized During Treatment: Gait belt Activity Tolerance: Patient tolerated treatment well Patient left: with call bell/phone within reach;with family/visitor present;in bed Nurse Communication: Mobility status PT Visit Diagnosis: Other abnormalities of gait and mobility (R26.89);Pain Pain - Right/Left: Right Pain - part of body: Knee     Time: [id number] PT Time Calculation (min) (ACUTE ONLY): 43 min  Charges:  $Gait Training: 23-37 mins $Therapeutic Activity: 8-22 mins                    G Codes:       Ronald Atkinson, PTA Pager: [phone]     Ronald Atkinson 10/11/2017, 2:30 PM

## 2017-10-11 NOTE — Progress Notes (Signed)
Orthopedics Progress Note  Subjective: Patient resting comfortably. No new complaints.  Objective:  Vitals:   10/11/17 0448 10/11/17 1041  BP: (!) 150/78 (!) 159/72  Pulse: 98 92  Resp: 18 16  Temp: 97.6 F (36.4 C) 98.9 F (37.2 C)  SpO2: 93% 93%    General: Awake and alert  Musculoskeletal: right knee dressing CDI, compartments supple. No pain with PROM of the right ankle. Neurovascularly intact  Lab Results  Component Value Date   WBC 13.8 (H) 10/11/2017   HGB 10.2 (L) 10/11/2017   HCT 29.8 (L) 10/11/2017   MCV 86.6 10/11/2017   PLT 277 10/11/2017       Component Value Date/Time   NA 131 (L) 10/11/2017 0813   K 3.8 10/11/2017 0813   CL 100 (L) 10/11/2017 0813   CO2 23 10/11/2017 0813   GLUCOSE 177 (H) 10/11/2017 0813   BUN 19 10/11/2017 0813   CREATININE 1.59 (H) 10/11/2017 0813   CALCIUM 8.2 (L) 10/11/2017 0813   GFRNONAA 46 (L) 10/11/2017 0813   GFRAA 53 (L) 10/11/2017 0813    No results found for: INR, PROTIME  Assessment/Plan: Persistent bleeding from the inferior part of the right total knee incision. Plan: I&D RIGHT KNEE WITH WOUND CLOSURE I plan to inspect the repair of the peripatellar arthrotomy and wash out the superficial subcu closure.  Plan staple closure.  I appreciate the help of internal medicine regarding the acute mental status changes.  Will back down on the pain meds and understand the change on the diabetic management.  Labs do not look bad but will check the urine. Hopefully if he looks better tomorrow will plan to discharge him home with home health PT, OT, non skilled aide for assistance with ADLs and bathing   Almedia BallsSteven R. Ranell PatrickNorris, MD 10/11/2017 12:38 PM

## 2017-10-11 NOTE — Plan of Care (Signed)
  Activity: Ability to avoid complications of mobility impairment will improve 10/11/2017 1202 - Progressing by Darrow BussingArcilla, Raynie Steinhaus M, RN   Pain Management: Pain level will decrease with appropriate interventions 10/11/2017 1202 - Progressing by Darrow BussingArcilla, Mehlani Blankenburg M, RN

## 2017-10-11 NOTE — Anesthesia Preprocedure Evaluation (Addendum)
Anesthesia Evaluation  Patient identified by MRN, date of birth, ID band Patient awake    Reviewed: Allergy & Precautions, H&P , NPO status , Patient's Chart, lab work & pertinent test results  Airway Mallampati: III  TM Distance: >3 FB Neck ROM: Full    Dental no notable dental hx. (+) Upper Dentures, Dental Advisory Given, Missing   Pulmonary sleep apnea and Continuous Positive Airway Pressure Ventilation ,    Pulmonary exam normal breath sounds clear to auscultation       Cardiovascular Exercise Tolerance: Good hypertension, Pt. on medications (-) Past MI  Rhythm:Regular Rate:Normal     Neuro/Psych  Headaches, Anxiety Depression    GI/Hepatic negative GI ROS, Neg liver ROS,   Endo/Other  diabetes, Type 2, Insulin Dependent, Oral Hypoglycemic AgentsMorbid obesity  Renal/GU Renal InsufficiencyRenal disease  negative genitourinary   Musculoskeletal  (+) Arthritis , Osteoarthritis,    Abdominal   Peds  Hematology  (+) anemia ,   Anesthesia Other Findings   Reproductive/Obstetrics                           Anesthesia Physical  Anesthesia Plan  ASA: III  Anesthesia Plan: General   Post-op Pain Management:    Induction: Intravenous  PONV Risk Score and Plan: 3 and Ondansetron, Dexamethasone and Midazolam  Airway Management Planned: LMA  Additional Equipment: None  Intra-op Plan:   Post-operative Plan: Extubation in OR  Informed Consent: I have reviewed the patients History and Physical, chart, labs and discussed the procedure including the risks, benefits and alternatives for the proposed anesthesia with the patient or authorized representative who has indicated his/her understanding and acceptance.   Dental advisory given  Plan Discussed with: CRNA  Anesthesia Plan Comments:        Anesthesia Quick Evaluation

## 2017-10-11 NOTE — Anesthesia Procedure Notes (Signed)
Procedure Name: LMA Insertion Date/Time: 10/11/2017 3:48 PM Performed by: Coralee RudFlores, Michelangelo Rindfleisch, CRNA Pre-anesthesia Checklist: Patient identified, Emergency Drugs available, Suction available and Patient being monitored Patient Re-evaluated:Patient Re-evaluated prior to induction Oxygen Delivery Method: Circle system utilized Preoxygenation: Pre-oxygenation with 100% oxygen Induction Type: IV induction Ventilation: Mask ventilation without difficulty LMA: LMA with gastric port inserted LMA Size: 5.0 Number of attempts: 2 Placement Confirmation: positive ETCO2 and breath sounds checked- equal and bilateral Tube secured with: Tape Dental Injury: Teeth and Oropharynx as per pre-operative assessment

## 2017-10-11 NOTE — Progress Notes (Addendum)
Physical Therapy Treatment Patient Details Name: Ronald Atkinson MRN: [id number] DOB: May 30, 1956 Today's Date: 10/11/2017    History of Present Illness Pt is a 61 y/o male s/p R TKA. PMH includes DM, anxiety, depression, HTN, ACDF, OSA on CPAP, and R shoulder replacement.     PT Comments    Patient is making gradual progress with gait. Pt does continue to demonstrate fatigue and falling asleep easily during session. Pt continues to be unsteady with OOB mobility and at increased risk for falls. Given pt's current mobility level and progression further skilled PT services in a post acute setting may be worth considering to maximize independence and safety with mobility prior to d/c home with minimal assist from wife.      Follow Up Recommendations  DC plan and follow up therapy as arranged by surgeon;SNF; Supervision/Assistance - 24 hour     Equipment Recommendations  Rolling walker with 5" wheels;3in1 (PT);Other (comment)(Wide)    Recommendations for Other Services OT consult     Precautions / Restrictions Precautions Precautions: Knee Precaution Booklet Issued: Yes (comment) Precaution Comments: precautions/positioning reviewed with pt Restrictions Weight Bearing Restrictions: Yes RLE Weight Bearing: Weight bearing as tolerated    Mobility  Bed Mobility Overal bed mobility: Needs Assistance Bed Mobility: Supine to Sit     Supine to sit: Min guard     General bed mobility comments: cues for technique; pt used leg lifter to bring R LE to EOB; lots of increased time to complete task  Transfers Overall transfer level: Needs assistance Equipment used: Rolling walker (2 wheeled) Transfers: Sit to/from Stand Sit to Stand: From elevated surface;Min assist         General transfer comment: assist to steady when transitioning hand placement; cues for safe hand placement  Ambulation/Gait Ambulation/Gait assistance: Min assist;+2 safety/equipment(chair follow) Ambulation  Distance (Feet): 75 Feet Assistive device: Rolling walker (2 wheeled) Gait Pattern/deviations: Step-to pattern;Decreased step length - left;Decreased weight shift to right;Antalgic;Decreased stance time - right;Step-through pattern   Gait velocity interpretation: Below normal speed for age/gender General Gait Details: pt continues to rely heavily on UE support; cues for posture/forward gaze, safe use of AD, and increased bilat step lengths    Stairs            Wheelchair Mobility    Modified Rankin (Stroke Patients Only)       Balance Overall balance assessment: Needs assistance Sitting-balance support: Feet supported;Bilateral upper extremity supported Sitting balance-Leahy Scale: Fair     Standing balance support: Bilateral upper extremity supported;During functional activity Standing balance-Leahy Scale: Poor Standing balance comment: Reliant on RW for stability                             Cognition Arousal/Alertness: Lethargic Behavior During Therapy: Flat affect Overall Cognitive Status: Within Functional Limits for tasks assessed                                 General Comments: pt continues to be fatigued and closes eyes often during session      Exercises Total Joint Exercises Knee Flexion: AAROM;Right;5 reps;Seated Goniometric ROM: approx 65 degrees flexion AAROM    General Comments        Pertinent Vitals/Pain Pain Assessment: Faces Faces Pain Scale: Hurts little more Pain Location: R knee  Pain Descriptors / Indicators: Grimacing;Sore;Guarding Pain Intervention(s): Limited activity within patient's tolerance;Monitored during session;Repositioned;Patient  requesting pain meds-RN notified    Home Living                      Prior Function            PT Goals (current goals can now be found in the care plan section) Acute Rehab PT Goals PT Goal Formulation: With patient Time For Goal Achievement:  10/14/17 Potential to Achieve Goals: Good Progress towards PT goals: Progressing toward goals    Frequency    7X/week      PT Plan Current plan remains appropriate    Co-evaluation              AM-PAC PT "6 Clicks" Daily Activity  Outcome Measure  Difficulty turning over in bed (including adjusting bedclothes, sheets and blankets)?: Unable Difficulty moving from lying on back to sitting on the side of the bed? : Unable Difficulty sitting down on and standing up from a chair with arms (e.g., wheelchair, bedside commode, etc,.)?: Unable Help needed moving to and from a bed to chair (including a wheelchair)?: A Little Help needed walking in hospital room?: A Little Help needed climbing 3-5 steps with a railing? : A Lot 6 Click Score: 11    End of Session Equipment Utilized During Treatment: Gait belt Activity Tolerance: Patient limited by fatigue Patient left: with call bell/phone within reach;with family/visitor present;in chair;with chair alarm set Nurse Communication: Mobility status PT Visit Diagnosis: Other abnormalities of gait and mobility (R26.89);Pain Pain - Right/Left: Right Pain - part of body: Knee     Time: [id number] PT Time Calculation (min) (ACUTE ONLY): 51 min  Charges:  $Gait Training: 23-37 mins $Therapeutic Activity: 8-22 mins                    G Codes:       Erline LevineKellyn Imaya Duffy, PTA Pager: [phone]     Carolynne EdouardKellyn R Ashlinn Hemrick 10/11/2017, 9:44 AM

## 2017-10-11 NOTE — Anesthesia Postprocedure Evaluation (Signed)
Anesthesia Post Note  Patient: Ronald Atkinson  Procedure(s) Performed: IRRIGATION AND DEBRIDEMENT RIGHT KNEE WITH WOUND CLOSURE (Right Knee)     Patient location during evaluation: PACU Anesthesia Type: General Level of consciousness: awake and alert Pain management: pain level controlled Vital Signs Assessment: post-procedure vital signs reviewed and stable Respiratory status: spontaneous breathing, nonlabored ventilation and respiratory function stable Cardiovascular status: blood pressure returned to baseline and stable Postop Assessment: no apparent nausea or vomiting Anesthetic complications: no    Last Vitals:  Vitals:   10/11/17 1838 10/11/17 2032  BP: (!) 149/74 (!) 151/80  Pulse: 91 100  Resp:  18  Temp: 37.7 C 37 C  SpO2: 94%     Last Pain:  Vitals:   10/11/17 2032  TempSrc: Oral  PainSc:                  Beryle Lathehomas E Brock

## 2017-10-11 NOTE — Progress Notes (Signed)
Pt placed on CPAP for the night- tolerating well. 

## 2017-10-11 NOTE — Progress Notes (Signed)
Inpatient Diabetes Program Recommendations  AACE/ADA: New Consensus Statement on Inpatient Glycemic Control (2015)  Target Ranges:  Prepandial:   less than 140 mg/dL      Peak postprandial:   less than 180 mg/dL (1-2 hours)      Critically ill patients:  140 - 180 mg/dL   Lab Results  Component Value Date   GLUCAP 173 (H) 10/11/2017   HGBA1C 9.4 (H) 10/11/2017    Review of Glycemic Control Results for Ronald Atkinson, Ronald Atkinson (MRN [id number]) as of 10/11/2017 14:27  Ref. Range 10/11/2017 06:33 10/11/2017 08:13 10/11/2017 08:49 10/11/2017 11:13 10/11/2017 12:38  Glucose-Capillary Latest Ref Range: 65 - 99 mg/dL 78  782170 (H)  956173 (H)   Diabetes history: Type 2DM Outpatient Diabetes medications: Novolin 70/30 60 Units BID, Metformin 1,000mg  po BID Current orders for Inpatient glycemic control: Novolog 0-20 Units Resistant correction tid, Novolin 70/30 60 Units BID, Metformin 1,000mg  BID, Novolog 0-5 Units HS  Inpatient Diabetes Program Recommendations:    Given periods of hypoglycemia and NPO status consider decreasing Novolin 70/30 to 50 Units BID.   Thanks, Lujean RaveLauren Aranza Geddes, MSN, RNC-OB Diabetes Coordinator [phone] (8a-5p)

## 2017-10-12 ENCOUNTER — Encounter (HOSPITAL_COMMUNITY): Payer: Self-pay | Admitting: Orthopedic Surgery

## 2017-10-12 DIAGNOSIS — E119 Type 2 diabetes mellitus without complications: Secondary | ICD-10-CM

## 2017-10-12 DIAGNOSIS — N179 Acute kidney failure, unspecified: Secondary | ICD-10-CM

## 2017-10-12 DIAGNOSIS — G9340 Encephalopathy, unspecified: Secondary | ICD-10-CM

## 2017-10-12 DIAGNOSIS — I10 Essential (primary) hypertension: Secondary | ICD-10-CM

## 2017-10-12 DIAGNOSIS — F419 Anxiety disorder, unspecified: Secondary | ICD-10-CM

## 2017-10-12 LAB — BASIC METABOLIC PANEL
ANION GAP: 10 (ref 5–15)
BUN: 20 mg/dL (ref 6–20)
CALCIUM: 8.4 mg/dL — AB (ref 8.9–10.3)
CO2: 25 mmol/L (ref 22–32)
CREATININE: 1.58 mg/dL — AB (ref 0.61–1.24)
Chloride: 98 mmol/L — ABNORMAL LOW (ref 101–111)
GFR, EST AFRICAN AMERICAN: 54 mL/min — AB (ref >60)
GFR, EST NON AFRICAN AMERICAN: 46 mL/min — AB (ref >60)
Glucose, Bld: 204 mg/dL — ABNORMAL HIGH (ref 65–99)
Potassium: 4.2 mmol/L (ref 3.5–5.1)
SODIUM: 133 mmol/L — AB (ref 135–145)

## 2017-10-12 LAB — GLUCOSE, CAPILLARY
GLUCOSE-CAPILLARY: 211 mg/dL — AB (ref 65–99)
GLUCOSE-CAPILLARY: 89 mg/dL (ref 65–99)
Glucose-Capillary: 168 mg/dL — ABNORMAL HIGH (ref 65–99)

## 2017-10-12 LAB — CBC
HCT: 29.5 % — ABNORMAL LOW (ref 39.0–52.0)
HEMOGLOBIN: 10.1 g/dL — AB (ref 13.0–17.0)
MCH: 29.7 pg (ref 26.0–34.0)
MCHC: 34.2 g/dL (ref 30.0–36.0)
MCV: 86.8 fL (ref 78.0–100.0)
PLATELETS: 311 10*3/uL (ref 150–400)
RBC: 3.4 MIL/uL — AB (ref 4.22–5.81)
RDW: 15.9 % — ABNORMAL HIGH (ref 11.5–15.5)
WBC: 13.4 10*3/uL — ABNORMAL HIGH (ref 4.0–10.5)

## 2017-10-12 MED ORDER — POLYETHYLENE GLYCOL 3350 17 G PO PACK
17.0000 g | PACK | Freq: Every day | ORAL | Status: DC
  Administered 2017-10-12: 17 g via ORAL
  Filled 2017-10-12: qty 1

## 2017-10-12 NOTE — Discharge Summary (Signed)
Physician Discharge Summary    Patient ID: Ronald Atkinson MRN: [id number] DOB/AGE: November 30, 1955 61 y.o.  Admit date: 10/07/2017 Discharge date:  10/12/17  Procedures:  Procedure(s) (LRB): IRRIGATION AND DEBRIDEMENT RIGHT KNEE WITH WOUND CLOSURE (Right)  Attending Physician:  Dr. Malon Kindle  Admission Diagnoses:   Right knee end stage osteoarthritis  Consultants:  Internal medicine, PT/OT  Discharge Diagnoses:  Principal Problem:   Acute encephalopathy Active Problems:   Status post total knee replacement, right   Hypertension   Diabetes mellitus without complication (HCC)   Anxiety   Hyponatremia   AKI (acute kidney injury) (HCC)  Past Medical History:  Diagnosis Date  . Anxiety   . Arthritis   . Depression   . Diabetes mellitus without complication (HCC)    takes pills and insulin  -  dx 2008  . Headache(784.0)    cluster headaches  . Hypertension   . Sleep apnea    tested 2015     [facility]: [facility]   Discharged Condition: fair  Hospital Course:  Patient underwent the above stated procedure on 10/07/2017. Patient tolerated the procedure well and brought to the recovery room in good condition and subsequently to the floor. During the next 2 days the wound continued to drain. Pt was taken back to the operating room on 10/11/17 for I&D and wound closure, which went well. Pt continued with therapy and improved. Medical team evaluated pt for hyponatremia, hyperglycemia and encephalopathy. Pt improved and d/c home.  Disposition: 01-Home or Self Care with follow up in 2 weeks  Medications: Current Facility-Administered Medications  Medication Dose Route Frequency Provider Last Rate Last Dose  . 0.9 %  sodium chloride infusion   Intravenous Continuous Beverely Low, MD 50 mL/hr at 10/12/17 0024    . acetaminophen (TYLENOL) tablet 650 mg  650 mg Oral Q4H PRN Beverely Low, MD       Or  . acetaminophen (TYLENOL) suppository 650 mg  650 mg Rectal Q4H PRN  Beverely Low, MD      . aspirin chewable tablet 81 mg  81 mg Oral BID Beverely Low, MD   81 mg at 10/12/17 4098  . bisacodyl (DULCOLAX) EC tablet 5 mg  5 mg Oral BID Beverely Low, MD   5 mg at 10/12/17 1191  . bisacodyl (DULCOLAX) suppository 10 mg  10 mg Rectal Daily PRN Beverely Low, MD      . buPROPion [facility]) tablet 200 mg  200 mg Oral Daily Beverely Low, MD   200 mg at 10/12/17 0955   And  . buPROPion [facility]) tablet 100 mg  100 mg Oral q1800 Beverely Low, MD   100 mg at 10/10/17 1852  . calcium carbonate (TUMS - dosed in mg elemental calcium) chewable tablet 200-400 mg of elemental calcium  1-2 tablet Oral BID Beverely Low, MD   200 mg of elemental calcium at 10/12/17 0956  . clindamycin (CLEOCIN) IVPB 600 mg  600 mg Intravenous Wiliam Ke, MD   Stopped at 10/12/17 1142  . docusate sodium (COLACE) capsule 100 mg  100 mg Oral BID Beverely Low, MD   100 mg at 10/12/17 0955  . ferrous sulfate tablet 325 mg  325 mg Oral TID Arlyn Dunning, MD   325 mg at 10/12/17 [phone]  . fluticasone (FLONASE) 50 MCG/ACT nasal spray 1 spray  1 spray Each Nare BID PRN Beverely Low, MD      . gabapentin (NEURONTIN) capsule 400 mg  400 mg Oral BID  Gwenyth Bender, NP   400 mg at 10/12/17 1610  . HYDROcodone-acetaminophen (NORCO/VICODIN) 5-325 MG per tablet 1 tablet  1 tablet Oral Q4H PRN Beverely Low, MD   1 tablet at 10/12/17 1031  . insulin aspart (novoLOG) injection 0-20 Units  0-20 Units Subcutaneous TID WC Colvin Caroli, PA-C   7 Units at 10/12/17 [phone]  . insulin aspart (novoLOG) injection 0-5 Units  0-5 Units Subcutaneous QHS Colvin Caroli, PA-C   2 Units at 10/11/17 2227  . insulin aspart protamine- aspart (NOVOLOG MIX 70/30) injection 60 Units  60 Units Subcutaneous BID Juliette Alcide, MD   60 Units at 10/12/17 1030  . loratadine (CLARITIN) tablet 10 mg  10 mg Oral Daily PRN Beverely Low, MD      . lurasidone (LATUDA) tablet 40 mg  40 mg Oral QPM Beverely Low, MD    40 mg at 10/10/17 1852  . magnesium oxide (MAG-OX) tablet 400 mg  400 mg Oral BID Beverely Low, MD   400 mg at 10/12/17 0956  . menthol-cetylpyridinium (CEPACOL) lozenge 3 mg  1 lozenge Oral PRN Beverely Low, MD       Or  . phenol [facility]) mouth spray 1 spray  1 spray Mouth/Throat PRN Beverely Low, MD      . metFORMIN (GLUCOPHAGE) tablet 1,000 mg  1,000 mg Oral BID WC Beverely Low, MD   1,000 mg at 10/12/17 0843  . methocarbamol (ROBAXIN) tablet 500 mg  500 mg Oral Q6H PRN Gwenyth Bender, NP   500 mg at 10/12/17 1031   Or  . methocarbamol (ROBAXIN) 500 mg in dextrose 5 % 50 mL IVPB  500 mg Intravenous Q6H PRN Gwenyth Bender, NP      . metoCLOPramide (REGLAN) tablet 5-10 mg  5-10 mg Oral Q8H PRN Beverely Low, MD       Or  . metoCLOPramide (REGLAN) injection 5-10 mg  5-10 mg Intravenous Q8H PRN Beverely Low, MD      . multivitamin with minerals tablet 1 tablet  1 tablet Oral Daily Beverely Low, MD   1 tablet at 10/12/17 [phone]  . ondansetron (ZOFRAN) tablet 4 mg  4 mg Oral Q6H PRN Beverely Low, MD       Or  . ondansetron [facility]) injection 4 mg  4 mg Intravenous Q6H PRN Beverely Low, MD      . polyethylene glycol (MIRALAX / GLYCOLAX) packet 17 g  17 g Oral Daily Rai, Ripudeep K, MD   17 g at 10/12/17 0955  . traZODone (DESYREL) tablet 100 mg  100 mg Oral Wendi Maya, MD   100 mg at 10/11/17 2207  . venlafaxine XR (EFFEXOR-XR) 24 hr capsule 150 mg  150 mg Oral Q breakfast Beverely Low, MD   150 mg at 10/12/17 8119    Follow-up Information    Beverely Low, MD. Call in 2 week(s).   Specialty:  Orthopedic Surgery Why:  [phone] Contact information: [address] [place] [address] [phone]        [facility] Follow up.   Specialty:  Home Health Services Why:  A representative from [facility] will contact you to arrange start date and time for your therapy. Contact information: [address] [place] [address] [place]  [address] [phone]           Discharge Instructions    Call MD / Call 911   Complete by:  As directed  If you experience chest pain or shortness of breath, CALL 911 and be transported to the hospital emergency room.  If you develope a fever above 101 F, pus (white drainage) or increased drainage or redness at the wound, or calf pain, call your surgeon's office.   Call MD / Call 911   Complete by:  As directed    If you experience chest pain or shortness of breath, CALL 911 and be transported to the hospital emergency room.  If you develope a fever above 101 F, pus (white drainage) or increased drainage or redness at the wound, or calf pain, call your surgeon's office.   Call MD / Call 911   Complete by:  As directed    If you experience chest pain or shortness of breath, CALL 911 and be transported to the hospital emergency room.  If you develope a fever above 101 F, pus (white drainage) or increased drainage or redness at the wound, or calf pain, call your surgeon's office.   Change dressing   Complete by:  As directed    Change dressing on Thursday with fresh gel bandage, then use other one for three more days then no dressing.   Constipation Prevention   Complete by:  As directed    Drink plenty of fluids.  Prune juice may be helpful.  You may use a stool softener, such as Colace (over the counter) 100 mg twice a day.  Use MiraLax (over the counter) for constipation as needed.   Constipation Prevention   Complete by:  As directed    Drink plenty of fluids.  Prune juice may be helpful.  You may use a stool softener, such as Colace (over the counter) 100 mg twice a day.  Use MiraLax (over the counter) for constipation as needed.   Constipation Prevention   Complete by:  As directed    Drink plenty of fluids.  Prune juice may be helpful.  You may use a stool softener, such as Colace (over the counter) 100 mg twice a day.  Use MiraLax (over the counter) for constipation as needed.    Diet - low sodium heart healthy   Complete by:  As directed    Diet - low sodium heart healthy   Complete by:  As directed    Diet - low sodium heart healthy   Complete by:  As directed    Driving restrictions   Complete by:  As directed    No driving for 2 weeks   Increase activity slowly as tolerated   Complete by:  As directed    Increase activity slowly as tolerated   Complete by:  As directed    Increase activity slowly as tolerated   Complete by:  As directed    TED hose   Complete by:  As directed    Use stockings (TED hose) for 4 weeks on both leg(s).  These prevent blood clots      Allergies as of 10/12/2017      Reactions   Bee Venom Anaphylaxis, Hives   Penicillins Shortness Of Breath, Rash, Other (See Comments)   PATIENT HAS HAD A PCN REACTION WITH IMMEDIATE RASH, FACIAL/TONGUE/THROAT SWELLING, SOB, OR LIGHTHEADEDNESS WITH HYPOTENSION:  #  #  #  YES  #  #  #   Has patient had a PCN reaction causing severe rash involving mucus membranes or skin necrosis:  #  #  #  NO  #  #  #  PATIENT HAS  HAD A PCN REACTION THAT REQUIRED HOSPITALIZATION:  #  #  #  YES  #  #  #   Has patient had a PCN reaction occurring within the last 10 years: #  #  #  YES  #  #  #    Morphine Rash      Medication List    TAKE these medications   aspirin 81 MG chewable tablet Commonly known as:  ASPIRIN CHILDRENS Chew 1 tablet (81 mg total) by mouth 2 (two) times daily.   bisacodyl 5 MG EC tablet Commonly known as:  DULCOLAX Take 5 mg by mouth 2 (two) times daily.   buPROPion 100 MG tablet Commonly known as:  WELLBUTRIN Take 100-200 mg 2 (two) times daily by mouth. Takes 2 tablets in the morning and 1 tablet in the afternoon   calcium carbonate 500 MG chewable tablet Commonly known as:  TUMS - dosed in mg elemental calcium Chew 1-2 tablets 2 (two) times daily by mouth. Depends on stomach pain if takes 1-2 tablets   Continuous Blood Gluc Sensor Misc 1 each by Does not apply route as  directed. Use as directed every 10 days. May dispense FreeStyle [employer] Sensor System or similar.   cyclobenzaprine 10 MG tablet Commonly known as:  FLEXERIL Take 10 mg at bedtime by mouth.   diazepam 5 MG tablet Commonly known as:  VALIUM Take 1 tablet (5 mg total) by mouth every 6 (six) hours as needed for muscle spasms. What changed:  when to take this   DSS 100 MG Caps Take 100 mg by mouth 2 (two) times daily.   fluticasone 50 MCG/ACT nasal spray Commonly known as:  FLONASE Place 1 spray 2 (two) times daily as needed into both nostrils for allergies or rhinitis.   gabapentin 800 MG tablet Commonly known as:  NEURONTIN Take 800 mg by mouth 2 (two) times daily.   LONGEVITY Caps Take 1 each daily by mouth. Healthy Body Star pack 2.0   loratadine 10 MG tablet Commonly known as:  CLARITIN Take 10 mg daily as needed by mouth for allergies.   lurasidone 40 MG Tabs tablet Commonly known as:  LATUDA Take 40 mg every evening by mouth.   Magnesium Oxide 420 MG Tabs Take 420 mg 2 (two) times daily by mouth.   metFORMIN 1000 MG tablet Commonly known as:  GLUCOPHAGE Take 1,000 mg by mouth 2 (two) times daily with a meal.   NOVOLIN 70/30 FLEXPEN [place] Inject 60 Units 2 (two) times daily before a meal into the skin.   oxyCODONE-acetaminophen 10-325 MG tablet Commonly known as:  PERCOCET Take 1 tablet by mouth every 4 (four) hours as needed for pain. What changed:  when to take this   oxyCODONE-acetaminophen 10-325 MG tablet Commonly known as:  PERCOCET Take 1 tablet by mouth every 4 (four) hours as needed for pain. What changed:  You were already taking a medication with the same name, and this prescription was added. Make sure you understand how and when to take each.   sildenafil 100 MG tablet Commonly known as:  VIAGRA Take 50 mg daily as needed by mouth for erectile dysfunction.   traZODone 100 MG tablet Commonly known as:  DESYREL Take 100 mg by mouth at bedtime.    venlafaxine XR 150 MG 24 hr capsule Commonly known as:  EFFEXOR-XR Take 150 mg daily with breakfast by mouth.            Discharge Care Instructions  (  From admission, onward)        Start     Ordered   10/10/17 0000  Change dressing    Comments:  Change dressing on Thursday with fresh gel bandage, then use other one for three more days then no dressing.   10/10/17 0981      Signed: Thea Gist 10/12/2017, 12:08 PM

## 2017-10-12 NOTE — Progress Notes (Signed)
Physical Therapy Treatment Patient Details Name: Ronald Atkinson MRN: [id number] DOB: 08/12/1956 Today's Date: 10/12/2017    History of Present Illness Pt is a 61 y/o male s/p R TKA. PMH includes DM, anxiety, depression, HTN, ACDF, OSA on CPAP, and R shoulder replacement.     PT Comments    Patient demonstrated improved gait mechanics and safety with ambulation. Pt tolerated increased gait distance and no LOB during session. Pt with SpO2 90% or > on RA while ambulating with cues for breathing technique. Continue to progress as tolerated.    Follow Up Recommendations  DC plan and follow up therapy as arranged by surgeon;Supervision/Assistance - 24 hour;SNF     Equipment Recommendations  Rolling walker with 5" wheels;3in1 (PT);Other (comment)(Wide)    Recommendations for Other Services OT consult     Precautions / Restrictions Precautions Precautions: Knee Precaution Booklet Issued: Yes (comment) Precaution Comments: precautions/positioning reviewed with pt Restrictions Weight Bearing Restrictions: Yes RLE Weight Bearing: Weight bearing as tolerated    Mobility  Bed Mobility Overal bed mobility: Needs Assistance Bed Mobility: Supine to Sit     Supine to sit: Mod assist     General bed mobility comments: pt OOB in chair upon arrival  Transfers Overall transfer level: Needs assistance Equipment used: Rolling walker (2 wheeled) Transfers: Sit to/from Stand Sit to Stand: Min guard         General transfer comment: cues for hand placement; min guard for safety; worked on getting closer to seat, bed, etc... before turning around to avoid stepping backwards as much as possible  Ambulation/Gait Ambulation/Gait assistance: Min guard Ambulation Distance (Feet): 150 Feet Assistive device: Rolling walker (2 wheeled) Gait Pattern/deviations: Decreased weight shift to right;Antalgic;Step-through pattern;Decreased stride length Gait velocity: Decreased   General Gait  Details: cues for posture, cadence, stride length, and breathing technique; SpO2 90% or > while ambulating   Stairs            Wheelchair Mobility    Modified Rankin (Stroke Patients Only)       Balance Overall balance assessment: Needs assistance Sitting-balance support: Feet supported;Bilateral upper extremity supported Sitting balance-Leahy Scale: Fair     Standing balance support: Bilateral upper extremity supported;During functional activity Standing balance-Leahy Scale: Poor Standing balance comment: Reliant on RW for stability                High Level Balance Comments: no LOB with horizontal head turns            Cognition Arousal/Alertness: Awake/alert Behavior During Therapy: WFL for tasks assessed/performed Overall Cognitive Status: Within Functional Limits for tasks assessed                                        Exercises      General Comments General comments (skin integrity, edema, etc.): wife present; no drainage on dressing end of session      Pertinent Vitals/Pain Pain Assessment: Faces Pain Score: 9  Faces Pain Scale: Hurts little more Pain Location: R knee  Pain Descriptors / Indicators: Guarding;Sore Pain Intervention(s): Limited activity within patient's tolerance;Monitored during session;Premedicated before session;Repositioned    Home Living                      Prior Function            PT Goals (current goals can now be found in the  care plan section) Acute Rehab PT Goals Patient Stated Goal: walk without pain PT Goal Formulation: With patient Time For Goal Achievement: 10/14/17 Potential to Achieve Goals: Good Progress towards PT goals: Progressing toward goals    Frequency    7X/week      PT Plan Current plan remains appropriate    Co-evaluation              AM-PAC PT "6 Clicks" Daily Activity  Outcome Measure  Difficulty turning over in bed (including adjusting  bedclothes, sheets and blankets)?: Unable Difficulty moving from lying on back to sitting on the side of the bed? : Unable Difficulty sitting down on and standing up from a chair with arms (e.g., wheelchair, bedside commode, etc,.)?: Unable Help needed moving to and from a bed to chair (including a wheelchair)?: A Little Help needed walking in hospital room?: A Little Help needed climbing 3-5 steps with a railing? : A Lot 6 Click Score: 11    End of Session Equipment Utilized During Treatment: Gait belt Activity Tolerance: Patient tolerated treatment well Patient left: with call bell/phone within reach;with family/visitor present;in chair Nurse Communication: Mobility status PT Visit Diagnosis: Other abnormalities of gait and mobility (R26.89);Pain Pain - Right/Left: Right Pain - part of body: Knee     Time: 1128-1204 PT Time Calculation (min) (ACUTE ONLY): 36 min  Charges:  $Gait Training: 8-22 mins $Therapeutic Activity: 8-22 mins                    G Codes:       Erline LevineKellyn Jonne Rote, PTA Pager: [phone]     Carolynne EdouardKellyn R Ledarrius Beauchaine 10/12/2017, 1:21 PM

## 2017-10-12 NOTE — Op Note (Signed)
NAME:  Ronald Atkinson, Ronald Atkinson                    ACCOUNT NO.:  MEDICAL RECORD NO.:  [id number]  LOCATION:                                 FACILITY:  PHYSICIAN:  Almedia BallsSteven R. Ranell PatrickNorris, M.D.      DATE OF BIRTH:  DATE OF PROCEDURE:  10/11/2017 DATE OF DISCHARGE:                              OPERATIVE REPORT   PREOPERATIVE DIAGNOSIS:  Persistent bleeding, status post total knee replacement on the right.  POSTOPERATIVE DIAGNOSIS:  Persistent bleeding, status post total knee replacement on the right.  PROCEDURE PERFORMED:  Irrigation and debridement of right total knee wound with primary closure.  ATTENDING SURGEON:  Almedia BallsSteven R. Ranell PatrickNorris, MD.  ASSISTANT:  Modesto Charonhomas Brad Dixon, [facility], who was scrubbed during the procedure and necessary for satisfactory completion of the procedure due to the patient's large size and greater than 300 pounds.  INSTRUMENT COUNTS:  Correct.  COMPLICATIONS:  There were no complications.  Perioperative antibiotics were given.  ANESTHESIA:  General anesthesia via laryngeal mask was used.  INDICATIONS:  The patient is a 61 year old male status post total knee replacement for end-stage arthritis.  The patient has had postoperative bleeding coming from the distal wound that has been unable to be controlled despite daily pressure dressings.  I discussed with the patient my concerns over leaving the wound continuing to drain as that could be an avenue for infection in the knee.  He agreed.  In consultation with he and his wife, I discussed taking the patient back to Surgery with washout of the superficial layer of the closure of the subcu and the skin and then closure with staples.  Risks and benefits were discussed.  Informed consent obtained.  DESCRIPTION OF PROCEDURE:  After an adequate level of anesthesia was achieved, the patient was positioned supine on the operating room table. The right leg was correctly identified and sterilely prepped and draped in usual manner.   Time-out called.  We went ahead and used hemostats and scissors to open up the incision, which had not fully healed.  It was noted that at the distal portion of the incision that one of the Vicryl subcu stitches had untied itself and that there was a dead space area there where blood collected from above and that was weeping out through the subcuticular stitch.  This was likely the cause for the persistent bleeding there.  There was no evidence for infection.  We irrigated using 3 L of normal saline irrigation with a cysto tubing.  Once we had removed all the patient's prior suture material, we re-sutured with 2 Vicryl subcu and 0 Vicryl subcu interrupted with buried knots followed by staples for the skin.  Sterile compressive bandage applied.  I ranged the knee.  No significant bleeding noted and then we transported the patient stable to the recovery room having tolerated the procedure well.     Almedia BallsSteven R. Ranell PatrickNorris, M.D.     SRN/MEDQ  D:  10/11/2017  T:  10/12/2017  Job:  [id number]

## 2017-10-12 NOTE — Progress Notes (Signed)
Orthopedics Progress Note  Subjective: Patient with more pain this AM after I+D and re-closure of wound  Objective:  Vitals:   10/12/17 0008 10/12/17 0408  BP: (!) 151/79 (!) 144/78  Pulse: 92 93  Resp: 18 18  Temp: 98 F (36.7 C) 98.2 F (36.8 C)  SpO2: 95% 100%    General: Awake and alert  Musculoskeletal: right knee dressing intact Neurovascularly intact  Lab Results  Component Value Date   WBC 13.8 (H) 10/11/2017   HGB 10.2 (L) 10/11/2017   HCT 29.8 (L) 10/11/2017   MCV 86.6 10/11/2017   PLT 277 10/11/2017       Component Value Date/Time   NA 131 (L) 10/11/2017 0813   K 3.8 10/11/2017 0813   CL 100 (L) 10/11/2017 0813   CO2 23 10/11/2017 0813   GLUCOSE 177 (H) 10/11/2017 0813   BUN 19 10/11/2017 0813   CREATININE 1.59 (H) 10/11/2017 0813   CALCIUM 8.2 (L) 10/11/2017 0813   GFRNONAA 46 (L) 10/11/2017 0813   GFRAA 53 (L) 10/11/2017 0813    No results found for: INR, PROTIME  Assessment/Plan: POD #1 s/p Procedure(s): IRRIGATION AND DEBRIDEMENT RIGHT KNEE WITH WOUND CLOSURE POD #5 s/p TKR Right knee Likely discharge home after therapy today as long as he clears PT and home health arranged   Almedia BallsSteven R. Ranell PatrickNorris, MD 10/12/2017 5:36 AM

## 2017-10-12 NOTE — Progress Notes (Signed)
   Subjective: 1 Day Post-Op Procedure(s) (LRB): IRRIGATION AND DEBRIDEMENT RIGHT KNEE WITH WOUND CLOSURE (Right)  S/p right total knee replacement Pain is moderate but tolerable Mentation has improved today Therapy went well today with walking Ready for d/c home and been cleared from medical team Patient reports pain as moderate.  Objective:   VITALS:   Vitals:   10/12/17 0408 10/12/17 0823  BP: (!) 144/78 (!) 175/73  Pulse: 93 98  Resp: 18   Temp: 98.2 F (36.8 C) 99.2 F (37.3 C)  SpO2: 100% 92%    Right knee incision healing well nv intact distally No bleeding or drainage  LABS Recent Labs    10/10/17 0538 10/11/17 0813 10/12/17 0542  HGB 10.3* 10.2* 10.1*  HCT 30.9* 29.8* 29.5*  WBC 12.2* 13.8* 13.4*  PLT 287 277 311    Recent Labs    10/11/17 0813 10/12/17 0542  NA 131* 133*  K 3.8 4.2  BUN 19 20  CREATININE 1.59* 1.58*  GLUCOSE 177* 204*     Assessment/Plan: 1 Day Post-Op Procedure(s) (LRB): IRRIGATION AND DEBRIDEMENT RIGHT KNEE WITH WOUND CLOSURE (Right) D/c home today F/u in 2 weeks in the office Continue home therapy Pain management Pulmonary toilet    Alphonsa OverallBrad Burnice Vassel, MPAS, PA-C  10/12/2017, 12:05 PM

## 2017-10-12 NOTE — Progress Notes (Signed)
Consult Progress Note                                                                               Patient Demographics  Ronald Atkinson, is a 61 y.o. male, DOB - 04-09-1956, ZOX:[id number]  Admit date - 10/07/2017   Admitting Physician Beverely LowSteve Norris, MD  [facility]  Outpatient specialists:   LOS - 5  days   Medical records reviewed and are as summarized below:    No chief complaint on file.      Brief summary   Patient is a 61 year old male with hypertension, diabetes type 2, anxiety, obesity, sleep apnea on C Pap at night, osteoarthritis end-stage of the knee. He was 5 days postop.  Hospital service consulted for management of the medical issues, uncontrolled diabetes.  Assessment & Plan    Principal Problem:   Acute encephalopathy -Resolved, alert and oriented x3.  Possibly narcotic effect versus obesity hypoventilation syndrome/OSA, received CPAP.  No UTI.  Active Problems:   Status post total knee replacement, right -Per primary service, orthopedics -Postop day #1 I&D of right knee with wound closure, postop day #5 status post total knee replacement of the right knee -PT OT evaluation    Hypertension -Controlled    Diabetes mellitus, type II, poorly controlled (HCC) -Home regimen includes oral agent and 70/30, 40 units twice daily -Hemoglobin A1c 9.4.  - Cont 70/30 insulin 60 units twice daily, continue metformin outpatient -Needs close follow-up with PCP to further adjustment of insulin    Anxiety -Currently stable, continue Effexor    Hyponatremia -Improving, alert and oriented    AKI (acute kidney injury) (HCC) -Improving from 1.7 to 1.5 today, will need close follow-up and repeat labs with PCP  Anemia of chronic disease -H&H currently stable  Code Status: Full CODE STATUS DVT Prophylaxis: SCDs Family Communication: Discussed in detail with the patient, all imaging results, lab results  explained to the patient and family member at the bedside   Disposition Plan: *per ortho, primary service    Time Spent in minutes   25  minutes  Procedures:   Antimicrobials:      Medications  Scheduled Meds: . aspirin  81 mg Oral BID  . bisacodyl  5 mg Oral BID  . buPROPion  200 mg Oral Daily   And  . buPROPion  100 mg Oral q1800  . calcium carbonate  1-2 tablet Oral BID  . docusate sodium  100 mg Oral BID  . ferrous sulfate  325 mg Oral TID PC  . gabapentin  400 mg Oral BID  . insulin aspart  0-20 Units Subcutaneous TID WC  . insulin aspart  0-5 Units Subcutaneous QHS  . insulin aspart protamine- aspart  60 Units Subcutaneous BID AC  . lurasidone  40 mg Oral QPM  . magnesium oxide  400 mg Oral BID  . metFORMIN  1,000 mg Oral BID WC  . multivitamin with minerals  1 tablet Oral Daily  . polyethylene glycol  17 g Oral Daily  . traZODone  100 mg Oral QHS  . venlafaxine XR  150 mg  Oral Q breakfast   Continuous Infusions: . sodium chloride 50 mL/hr at 10/12/17 0024  . clindamycin (CLEOCIN) IV 600 mg (10/12/17 0847)  . methocarbamol (ROBAXIN)  IV     PRN Meds:.acetaminophen **OR** acetaminophen, bisacodyl, fluticasone, HYDROcodone-acetaminophen, loratadine, menthol-cetylpyridinium **OR** phenol, methocarbamol **OR** methocarbamol (ROBAXIN)  IV, metoCLOPramide **OR** metoCLOPramide (REGLAN) injection, ondansetron **OR** ondansetron (ZOFRAN) IV   Antibiotics   Anti-infectives (From admission, onward)   Start     Dose/Rate Route Frequency Ordered Stop   10/11/17 2330  clindamycin (CLEOCIN) IVPB 600 mg     600 mg 100 mL/hr over 30 Minutes Intravenous Every 8 hours 10/11/17 1818 10/13/17 1529   10/07/17 1700  clindamycin (CLEOCIN) IVPB 600 mg     600 mg 100 mL/hr over 30 Minutes Intravenous Every 6 hours 10/07/17 1539 10/08/17 0531   10/07/17 1200  clindamycin (CLEOCIN) IVPB 900 mg     900 mg 100 mL/hr over 30 Minutes Intravenous To [facility] 10/06/17 0809  10/07/17 1206        Subjective:   Ronald Battlesdward Mcclaran was seen and examined today.  Alert and oriented, denies any specific complaints except pain, 4/10.  Feels slightly constipated today.  Patient denies dizziness, chest pain, shortness of breath, abdominal pain, N/V/D/C, new weakness, numbess, tingling. No acute events overnight.    Objective:   Vitals:   10/11/17 2335 10/12/17 0008 10/12/17 0408 10/12/17 0823  BP:  (!) 151/79 (!) 144/78 (!) 175/73  Pulse: 94 92 93 98  Resp: 18 18 18    Temp:  98 F (36.7 C) 98.2 F (36.8 C) 99.2 F (37.3 C)  TempSrc:  Oral Oral Oral  SpO2: 90% 95% 100% 92%  Weight:      Height:        Intake/Output Summary (Last 24 hours) at 10/12/2017 1023 Last data filed at 10/12/2017 0700 Gross per 24 hour  Intake 1841.67 ml  Output 2110 ml  Net -268.33 ml     Wt Readings from Last 3 Encounters:  10/07/17 (!) 140.2 kg (309 lb)  09/26/17 (!) 140.2 kg (309 lb)  08/12/14 125.6 kg (277 lb)     Exam  General: Alert and oriented x 3, NAD  Eyes:   HEENT:  Atraumatic, normocephalic  Cardiovascular: S1 S2 auscultated, no rubs, murmurs or gallops. Regular rate and rhythm.  Respiratory: Clear to auscultation bilaterally, no wheezing, rales or rhonchi  Gastrointestinal: Soft, nontender, nondistended, + bowel sounds  Ext: no pedal edema bilaterally  Neuro: no new deficits  Musculoskeletal: No digital cyanosis, clubbing  Skin: No rashes  Psych: Normal affect and demeanor, alert and oriented x3    Data Reviewed:  I have personally reviewed following labs and imaging studies  Micro Results No results found for this or any previous visit (from the past 240 hour(s)).  Radiology Reports Dg Chest 2 View  Result Date: 10/11/2017 CLINICAL DATA:  Shortness of Breath EXAM: CHEST  2 VIEW COMPARISON:  August 05, 2014 FINDINGS: There is no edema or consolidation. Heart is borderline enlarged with pulmonary vascularity within normal limits. No  adenopathy. There is postoperative change in the lower cervical spine. There is degenerative change in thoracic spine. IMPRESSION: Borderline cardiac enlargement.  No edema or consolidation. Electronically Signed   By: Bretta BangWilliam  Woodruff III M.D.   On: 10/11/2017 15:09    Lab Data:  CBC: Recent Labs  Lab 10/08/17 0549 10/09/17 0524 10/10/17 0538 10/11/17 0813 10/12/17 0542  WBC 13.9* 13.4* 12.2* 13.8* 13.4*  NEUTROABS  --   --   --  10.1*  --   HGB 12.2* 10.6* 10.3* 10.2* 10.1*  HCT 37.8* 32.4* 30.9* 29.8* 29.5*  MCV 90.4 89.0 87.8 86.6 86.8  PLT 318 295 287 277 311   Basic Metabolic Panel: Recent Labs  Lab 10/08/17 0549 10/11/17 0813 10/12/17 0542  NA 135 131* 133*  K 4.8 3.8 4.2  CL 99* 100* 98*  CO2 27 23 25   GLUCOSE 211* 177* 204*  BUN 20 19 20   CREATININE 1.72* 1.59* 1.58*  CALCIUM 8.6* 8.2* 8.4*   GFR: Estimated Creatinine Clearance: 71.1 mL/min (A) (by C-G formula based on SCr of 1.58 mg/dL (H)). Liver Function Tests: Recent Labs  Lab 10/11/17 0813  AST 31  ALT 29  ALKPHOS 74  BILITOT 0.5  PROT 6.5  ALBUMIN 2.3*   No results for input(s): LIPASE, AMYLASE in the last 168 hours. No results for input(s): AMMONIA in the last 168 hours. Coagulation Profile: No results for input(s): INR, PROTIME in the last 168 hours. Cardiac Enzymes: No results for input(s): CKTOTAL, CKMB, CKMBINDEX, TROPONINI in the last 168 hours. BNP (last 3 results) No results for input(s): PROBNP in the last 8760 hours. HbA1C: Recent Labs    10/11/17 0813  HGBA1C 9.4*   CBG: Recent Labs  Lab 10/11/17 0849 10/11/17 1238 10/11/17 1643 10/11/17 2209 10/12/17 0636  GLUCAP 170* 173* 132* 220* 211*   Lipid Profile: No results for input(s): CHOL, HDL, LDLCALC, TRIG, CHOLHDL, LDLDIRECT in the last 72 hours. Thyroid Function Tests: No results for input(s): TSH, T4TOTAL, FREET4, T3FREE, THYROIDAB in the last 72 hours. Anemia Panel: No results for input(s): VITAMINB12, FOLATE,  FERRITIN, TIBC, IRON, RETICCTPCT in the last 72 hours. Urine analysis:    Component Value Date/Time   COLORURINE YELLOW 10/11/2017 1302   APPEARANCEUR HAZY (A) 10/11/2017 1302   LABSPEC 1.012 10/11/2017 1302   PHURINE 5.0 10/11/2017 1302   GLUCOSEU 50 (A) 10/11/2017 1302   HGBUR SMALL (A) 10/11/2017 1302   BILIRUBINUR NEGATIVE 10/11/2017 1302   KETONESUR NEGATIVE 10/11/2017 1302   PROTEINUR >=300 (A) 10/11/2017 1302   NITRITE NEGATIVE 10/11/2017 1302   LEUKOCYTESUR NEGATIVE 10/11/2017 1302     Ripudeep Rai M.D. [facility] 10/12/2017, 10:23 AM  Pager: [phone] Between 7am to 7pm - call Pager - [phone]  After 7pm go to [email or web address] - password [email or web address]  Call night coverage person covering after 7pm

## 2017-10-12 NOTE — Progress Notes (Signed)
Occupational Therapy Treatment Patient Details Name: Ronald Atkinson MRN: [id number] DOB: 1956-09-08 Today's Date: 10/12/2017    History of present illness Pt is a 61 y/o male s/p R TKA. PMH includes DM, anxiety, depression, HTN, ACDF, OSA on CPAP, and R shoulder replacement.    OT comments  Pt progressing towards acute OT goals. Focus of session was bed mobility, functional mobility and toilet transfers. Pain (9/10) a limiting factor this session. Pt needing mod A for bed mobility. Advised to sleep in recliner at home until he can practice bed mobility more [facility]. Mod A needed on initial stand from EOB, min guard-light min A from 3n1. Pt moving very slowly, guarded and with effort. RN gave pain med during session.    Follow Up Recommendations  Supervision/Assistance - 24 hour;Home health OT;SNF;Other (comment)(pt plans to d/c home.)    Equipment Recommendations  Other (comment)    Recommendations for Other Services      Precautions / Restrictions Precautions Precautions: Knee Precaution Comments: precautions/positioning reviewed with pt Restrictions Weight Bearing Restrictions: Yes RLE Weight Bearing: Weight bearing as tolerated       Mobility Bed Mobility Overal bed mobility: Needs Assistance Bed Mobility: Supine to Sit     Supine to sit: Mod assist     General bed mobility comments: assist to advance RLE and power up trunk. Trialed using rolled sheet as leg lifter with minimal success. Advised pt to sleep in his recliner at home until able to practice bed mobility [facility].   Transfers Overall transfer level: Needs assistance Equipment used: Rolling walker (2 wheeled) Transfers: Sit to/from Stand Sit to Stand: Mod assist;From elevated surface         General transfer comment: Pt struggled with initial powering up to standing from EOB (momentum utilized, bed height elevated) ultimately needing mod A.  min A to come from 3n1.     Balance  Overall balance assessment: Needs assistance Sitting-balance support: Feet supported;Bilateral upper extremity supported Sitting balance-Leahy Scale: Fair     Standing balance support: Bilateral upper extremity supported;During functional activity Standing balance-Leahy Scale: Poor Standing balance comment: Reliant on RW for stability                            ADL either performed or assessed with clinical judgement   ADL Overall ADL's : Needs assistance/impaired                         Toilet Transfer: Moderate assistance;Minimal assistance;Ambulation;RW(3n1 over toilet)   Toileting- Clothing Manipulation and Hygiene: Maximal assistance       Functional mobility during ADLs: Minimal assistance;Rolling walker;Min guard General ADL Comments: Pt completed bed mobility, functional mobility to/from bathroom, toilet transfer as detailed above. Pt moving slowly, with increased effort and guarded. 1 LOB turning around at toilet, pt mostly able to self correct with grab bar and rw, light min A given for safety.      Vision       Perception     Praxis      Cognition Arousal/Alertness: Awake/alert Behavior During Therapy: WFL for tasks assessed/performed Overall Cognitive Status: Within Functional Limits for tasks assessed                                          Exercises  Shoulder Instructions       General Comments Spouse present for most of session and included in education.     Pertinent Vitals/ Pain       Pain Assessment: 0-10 Pain Score: 9  Pain Location: R knee  Pain Descriptors / Indicators: Guarding Pain Intervention(s): Limited activity within patient's tolerance;Monitored during session;Repositioned;Patient requesting pain meds-RN notified;RN gave pain meds during session  Home Living                                          Prior Functioning/Environment              Frequency  Min 2X/week         Progress Toward Goals  OT Goals(current goals can now be found in the care plan section)  Progress towards OT goals: Progressing toward goals  Acute Rehab OT Goals Patient Stated Goal: walk without pain OT Goal Formulation: With patient Time For Goal Achievement: 10/22/17 Potential to Achieve Goals: Good ADL Goals Pt Will Perform Grooming: with supervision;standing Pt Will Perform Upper Body Bathing: with modified independence;sitting Pt Will Perform Lower Body Bathing: with modified independence;with adaptive equipment;sitting/lateral leans Pt Will Transfer to Toilet: with supervision;stand pivot transfer;bedside commode Pt Will Perform Toileting - Clothing Manipulation and hygiene: with min guard assist;sit to/from stand  Plan Discharge plan remains appropriate    Co-evaluation                 AM-PAC PT "6 Clicks" Daily Activity     Outcome Measure   Help from another person eating meals?: None Help from another person taking care of personal grooming?: A Little Help from another person toileting, which includes using toliet, bedpan, or urinal?: A Lot Help from another person bathing (including washing, rinsing, drying)?: A Lot Help from another person to put on and taking off regular upper body clothing?: None Help from another person to put on and taking off regular lower body clothing?: A Lot 6 Click Score: 17    End of Session    OT Visit Diagnosis: Unsteadiness on feet (R26.81);Other abnormalities of gait and mobility (R26.89);Pain Pain - Right/Left: Right Pain - part of body: Knee   Activity Tolerance Patient tolerated treatment well   Patient Left in chair;with call bell/phone within reach;with chair alarm set   Nurse Communication          Time: [id number] OT Time Calculation (min): 67 min  Charges: OT General Charges $OT Visit: 1 Visit OT Treatments $Self Care/Home Management : 53-67 mins     Pilar GrammesMathews, Athziri Freundlich H 10/12/2017, 12:15  PM

## 2017-10-18 ENCOUNTER — Telehealth: Payer: Self-pay | Admitting: *Deleted

## 2017-10-21 ENCOUNTER — Other Ambulatory Visit (HOSPITAL_COMMUNITY): Payer: Self-pay | Admitting: Physician Assistant

## 2017-10-21 MED ORDER — CONTINUOUS BLOOD GLUC SENSOR MISC: 1.0000 | 0 refills | Status: AC

## 2017-11-10 ENCOUNTER — Telehealth: Payer: Self-pay | Admitting: *Deleted

## 2018-01-15 IMAGING — DX DG CHEST 2V
2 series · 2 of 2 positions shown · non-contrast
Comparison: August 05, 2014

CLINICAL DATA: Shortness of Breath

EXAM:
CHEST  2 VIEW

[chest ap]
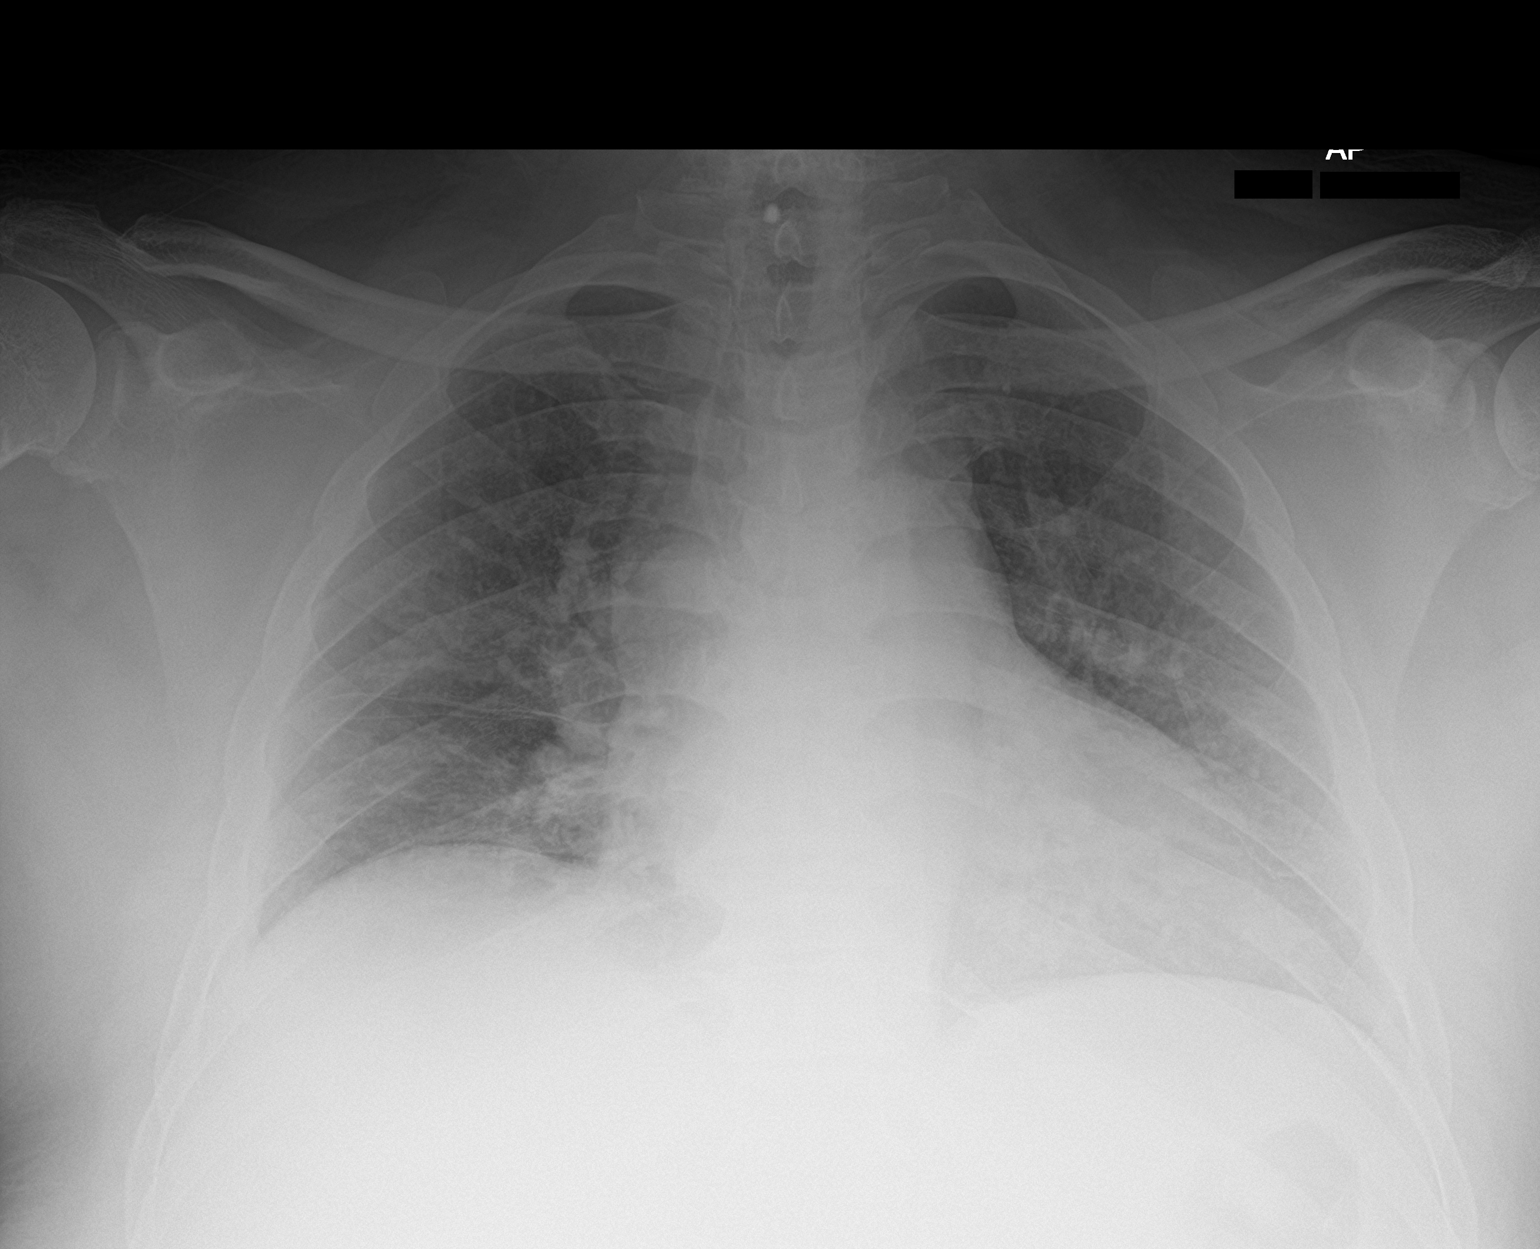

[chest lat]
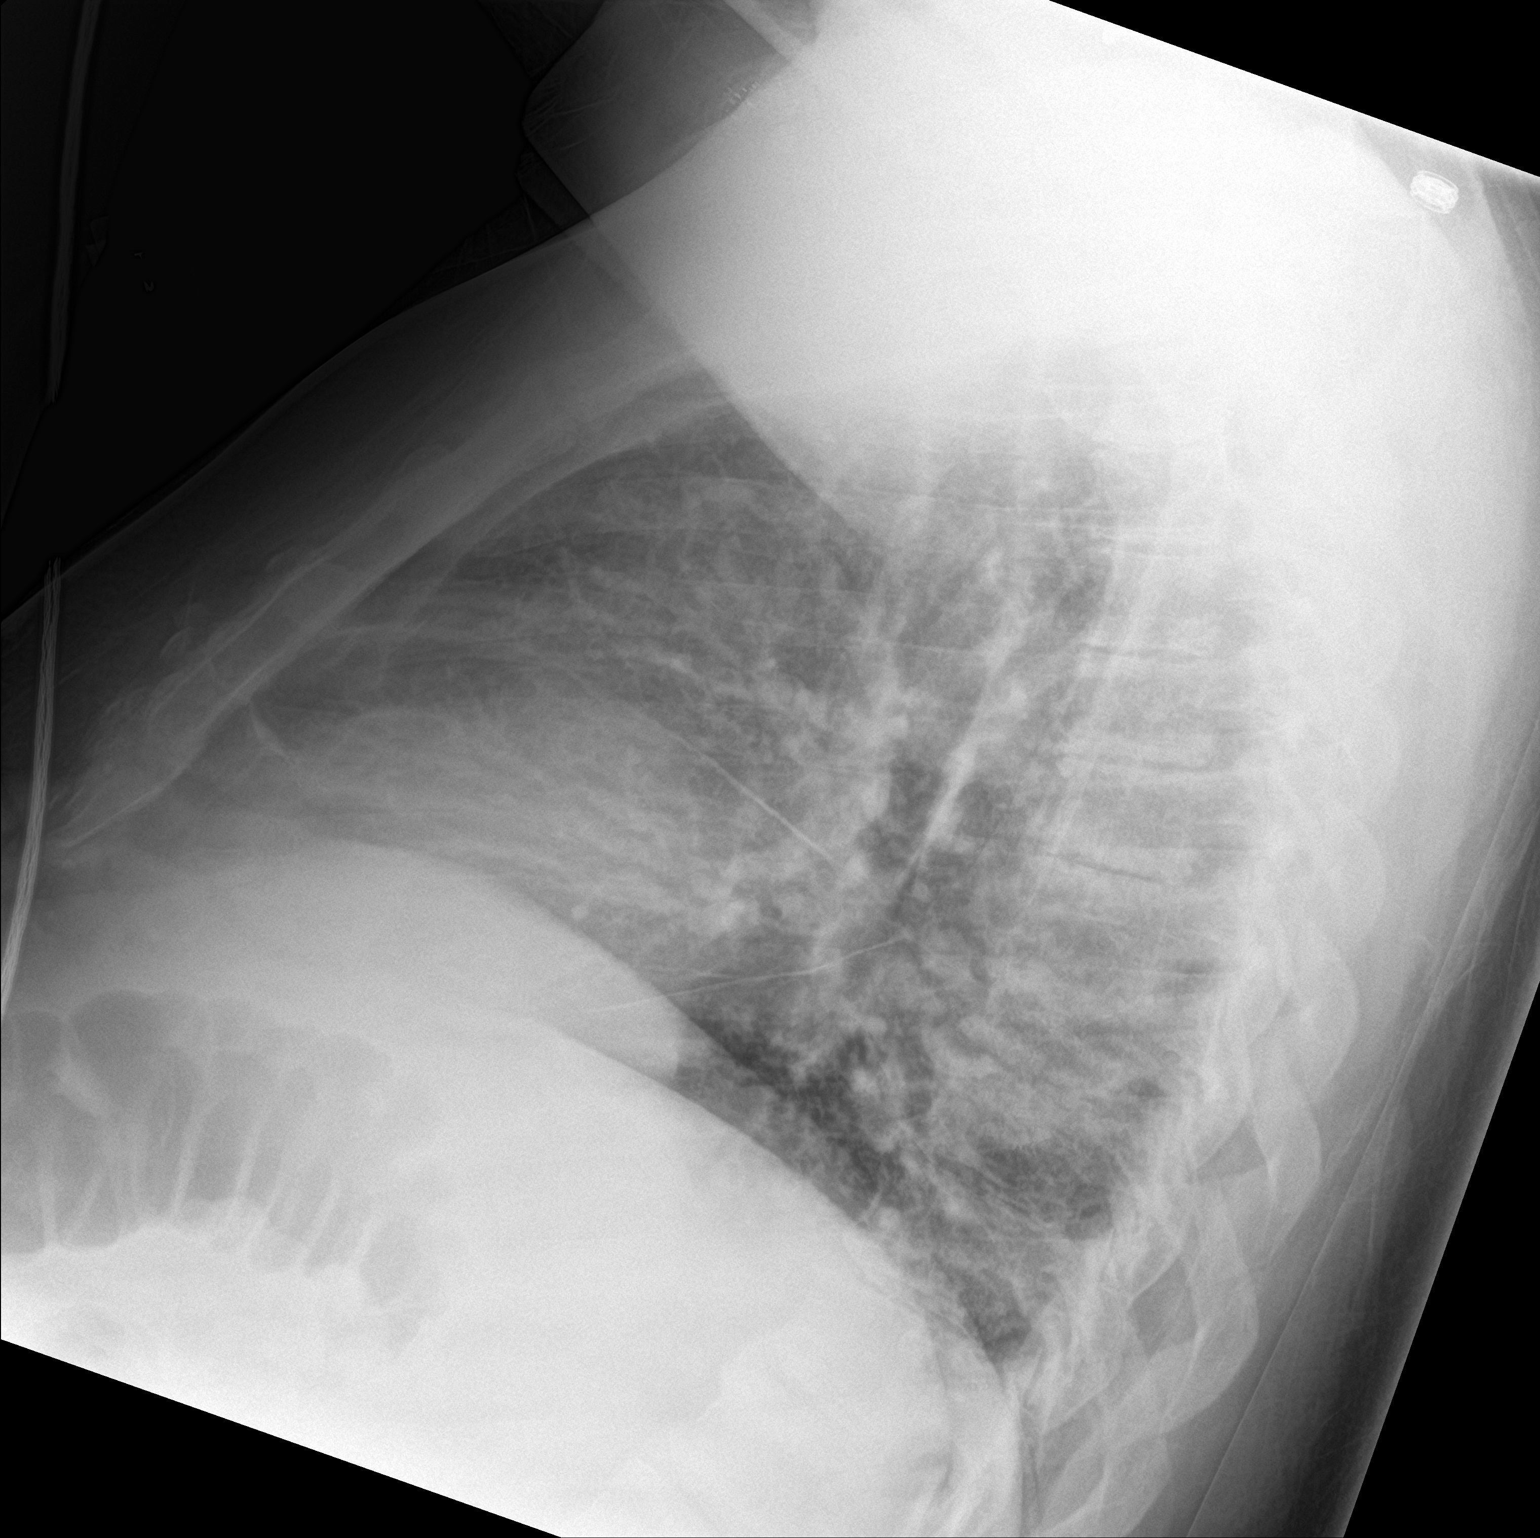

[2 of 2 positions shown; findings below may reference images not displayed]

FINDINGS: There is no edema or consolidation. Heart is borderline enlarged
with pulmonary vascularity within normal limits. No adenopathy.
There is postoperative change in the lower cervical spine. There is
degenerative change in thoracic spine.
IMPRESSION: Borderline cardiac enlargement.  No edema or consolidation.

## 2023-03-22 ENCOUNTER — Other Ambulatory Visit: Payer: Self-pay | Admitting: Neurosurgery

## 2023-04-06 NOTE — Pre-Procedure Instructions (Signed)
Surgical Instructions    Your procedure is scheduled on Wednesday, June 5th.  Report to [facility] Main Entrance "A" at 10:25 A.M., then check in with the Admitting office.  Call this number if you have problems the morning of surgery:  [phone]  If you have any questions prior to your surgery date call [phone]: Open Monday-Friday 8am-4pm If you experience any cold or flu symptoms such as cough, fever, chills, shortness of breath, etc. between now and your scheduled surgery, please notify us at the above number.     Remember:  Do not eat or drink after midnight the night before your surgery      Take these medicines the morning of surgery with A SIP OF WATER  amLODipine (NORVASC)  buPROPion (WELLBUTRIN XL)  carboxymethylcellulose (REFRESH PLUS) eye drops hydrOXYzine (VISTARIL)    If needed: fluticasone (FLONASE)  loratadine (CLARITIN)    As of today, STOP taking any Aspirin (unless otherwise instructed by your surgeon) Aleve, Naproxen, Ibuprofen, Motrin, Advil, Goody's, BC's, all herbal medications, diclofenac Sodium (VOLTAREN) gel, fish oil, and all vitamins.  WHAT DO I DO ABOUT MY DIABETES MEDICATION?   THE NIGHT BEFORE SURGERY, take 11 units (50%) of insulin glargine (LANTUS).     OR  THE MORNING OF SURGERY, take 11 units (50%) of insulin glargine (LANTUS).  If your CBG is greater than 220 mg/dL, you may take  of your sliding scale (correction) dose of insulin aspart (NOVOLOG FLEXPEN).   HOW TO MANAGE YOUR DIABETES BEFORE AND AFTER SURGERY  Why is it important to control my blood sugar before and after surgery? Improving blood sugar levels before and after surgery helps healing and can limit problems. A way of improving blood sugar control is eating a healthy diet by:  Eating less sugar and carbohydrates  Increasing activity/exercise  Talking with your doctor about reaching your blood sugar goals High blood sugars (greater than 180 mg/dL) can raise your  risk of infections and slow your recovery, so you will need to focus on controlling your diabetes during the weeks before surgery. Make sure that the doctor who takes care of your diabetes knows about your planned surgery including the date and location.  How do I manage my blood sugar before surgery? Check your blood sugar at least 4 times a day, starting 2 days before surgery, to make sure that the level is not too high or low.  Check your blood sugar the morning of your surgery when you wake up and every 2 hours until you get to the Short Stay unit.  If your blood sugar is less than 70 mg/dL, you will need to treat for low blood sugar: Do not take insulin. Treat a low blood sugar (less than 70 mg/dL) with  cup of clear juice (cranberry or apple), 4 glucose tablets, OR glucose gel. Recheck blood sugar in 15 minutes after treatment (to make sure it is greater than 70 mg/dL). If your blood sugar is not greater than 70 mg/dL on recheck, call [phone] for further instructions. Report your blood sugar to the short stay nurse when you get to Short Stay.  If you are admitted to the hospital after surgery: Your blood sugar will be checked by the staff and you will probably be given insulin after surgery (instead of oral diabetes medicines) to make sure you have good blood sugar levels. The goal for blood sugar control after surgery is 80-180 mg/dL.  Do NOT Smoke (Tobacco/Vaping) for 24 hours prior to your procedure.  If you use a CPAP at night, you may bring your mask/headgear for your overnight stay.   Contacts, glasses, piercing's, hearing aid's, dentures or partials may not be worn into surgery, please bring cases for these belongings.    For patients admitted to the hospital, discharge time will be determined by your treatment team.   Patients discharged the day of surgery will not be allowed to drive home, and someone needs to stay with them for 24 hours.  SURGICAL  WAITING ROOM VISITATION Patients having surgery or a procedure may have no more than 2 support people in the waiting area - these visitors may rotate.   Children under the age of 67 must have an adult with them who is not the patient. If the patient needs to stay at the hospital during part of their recovery, the visitor guidelines for inpatient rooms apply. Pre-op nurse will coordinate an appropriate time for 1 support person to accompany patient in pre-op.  This support person may not rotate.   Please refer to the [facility] website for the visitor guidelines for Inpatients (after your surgery is over and you are in a regular room).      Pre-operative 5 CHG Bath Instructions   You can play a key role in reducing the risk of infection after surgery. Your skin needs to be as free of germs as possible. You can reduce the number of germs on your skin by washing with CHG (chlorhexidine gluconate) soap before surgery. CHG is an antiseptic soap that kills germs and continues to kill germs even after washing.   DO NOT use if you have an allergy to chlorhexidine/CHG or antibacterial soaps. If your skin becomes reddened or irritated, stop using the CHG and notify one of our RNs at [phone].   Please shower with the CHG soap starting 4 days before surgery using the following schedule:     Please keep in mind the following:  DO NOT shave, including legs and underarms, starting the day of your first shower.   You may shave your face at any point before/day of surgery.  Place clean sheets on your bed the day you start using CHG soap. Use a clean washcloth (not used since being washed) for each shower. DO NOT sleep with pets once you start using the CHG.   CHG Shower Instructions:  If you choose to wash your hair and private area, wash first with your normal shampoo/soap.  After you use shampoo/soap, rinse your hair and body thoroughly to remove shampoo/soap residue.  Turn the water OFF and  apply about 3 tablespoons (45 ml) of CHG soap to a CLEAN washcloth.  Apply CHG soap ONLY FROM YOUR NECK DOWN TO YOUR TOES (washing for 3-5 minutes)  DO NOT use CHG soap on face, private areas, open wounds, or sores.  Pay special attention to the area where your surgery is being performed.  If you are having back surgery, having someone wash your back for you may be helpful. Wait 2 minutes after CHG soap is applied, then you may rinse off the CHG soap.  Pat dry with a clean towel  Put on clean clothes/pajamas   If you choose to wear lotion, please use ONLY the CHG-compatible lotions on the back of this paper.     Additional instructions for the day of surgery: DO NOT APPLY any lotions, deodorants, cologne, or perfumes.   Put on clean/comfortable clothes.  Brush your teeth.  Ask your nurse before applying any prescription medications to the skin. Do not wear jewelry  Do not bring valuables to the hospital. [facility] is not responsible for any belongings or valuables.    CHG Compatible Lotions   Aveeno Moisturizing lotion  Cetaphil Moisturizing Cream  Cetaphil Moisturizing Lotion  Clairol Herbal Essence Moisturizing Lotion, Dry Skin  Clairol Herbal Essence Moisturizing Lotion, Extra Dry Skin  Clairol Herbal Essence Moisturizing Lotion, Normal Skin  Curel Age Defying Therapeutic Moisturizing Lotion with Alpha Hydroxy  Curel Extreme Care Body Lotion  Curel Soothing Hands Moisturizing Hand Lotion  Curel Therapeutic Moisturizing Cream, Fragrance-Free  Curel Therapeutic Moisturizing Lotion, Fragrance-Free  Curel Therapeutic Moisturizing Lotion, Original Formula  Eucerin Daily Replenishing Lotion  Eucerin Dry Skin Therapy Plus Alpha Hydroxy Crme  Eucerin Dry Skin Therapy Plus Alpha Hydroxy Lotion  Eucerin Original Crme  Eucerin Original Lotion  Eucerin Plus Crme Eucerin Plus Lotion  Eucerin TriLipid Replenishing Lotion  Keri Anti-Bacterial Hand Lotion  Keri Deep Conditioning  Original Lotion Dry Skin Formula Softly Scented  Keri Deep Conditioning Original Lotion, Fragrance Free Sensitive Skin Formula  Keri Lotion Fast Absorbing Fragrance Free Sensitive Skin Formula  Keri Lotion Fast Absorbing Softly Scented Dry Skin Formula  Keri Original Lotion  Keri Skin Renewal Lotion Keri Silky Smooth Lotion  Keri Silky Smooth Sensitive Skin Lotion  Nivea Body Creamy Conditioning Oil  Nivea Body Extra Enriched Lotion  Nivea Body Original Lotion  Nivea Body Sheer Moisturizing Lotion Nivea Crme  Nivea Skin Firming Lotion  NutraDerm 30 Skin Lotion  NutraDerm Skin Lotion  NutraDerm Therapeutic Skin Cream  NutraDerm Therapeutic Skin Lotion  ProShield Protective Hand Cream  Provon moisturizing lotion    Do not wear jewelry  Do not bring valuables to the hospital. [facility] is not responsible for any belongings or valuables.     Please read over the following fact sheets that you were given.    If you received a COVID test during your pre-op visit  it is requested that you wear a mask when out in public, stay away from anyone that may not be feeling well and notify your surgeon if you develop symptoms. If you have been in contact with anyone that has tested positive in the last 10 days please notify you surgeon.

## 2023-04-07 ENCOUNTER — Encounter (HOSPITAL_COMMUNITY)
Admission: RE | Admit: 2023-04-07 | Discharge: 2023-04-07 | Disposition: A | Discharge status: Home / Self Care | Payer: No Typology Code available for payment source | Source: Ambulatory Visit | Attending: Neurosurgery | Admitting: Neurosurgery

## 2023-04-07 ENCOUNTER — Other Ambulatory Visit: Payer: Self-pay

## 2023-04-07 ENCOUNTER — Encounter (HOSPITAL_COMMUNITY): Payer: Self-pay

## 2023-04-07 VITALS — BP 135/76 | HR 73 | Temp 98.0°F | Resp 17 | Ht 70.0 in | Wt 244.0 lb

## 2023-04-07 DIAGNOSIS — Z794 Long term (current) use of insulin: Secondary | ICD-10-CM | POA: Diagnosis not present

## 2023-04-07 DIAGNOSIS — E119 Type 2 diabetes mellitus without complications: Secondary | ICD-10-CM

## 2023-04-07 DIAGNOSIS — Z01812 Encounter for preprocedural laboratory examination: Secondary | ICD-10-CM | POA: Diagnosis present

## 2023-04-07 DIAGNOSIS — Z01818 Encounter for other preprocedural examination: Secondary | ICD-10-CM

## 2023-04-07 HISTORY — DX: Chronic kidney disease, stage 3 unspecified: N18.30

## 2023-04-07 HISTORY — DX: Bipolar disorder, unspecified: F31.9

## 2023-04-07 LAB — BASIC METABOLIC PANEL
Anion gap: 8 (ref 5–15)
BUN: 16 mg/dL (ref 8–23)
CO2: 26 mmol/L (ref 22–32)
Calcium: 9.2 mg/dL (ref 8.9–10.3)
Chloride: 101 mmol/L (ref 98–111)
Creatinine, Ser: 1.84 mg/dL — ABNORMAL HIGH (ref 0.61–1.24)
GFR, Estimated: 40 mL/min — ABNORMAL LOW (ref >60)
Glucose, Bld: 155 mg/dL — ABNORMAL HIGH (ref 70–99)
Potassium: 4.7 mmol/L (ref 3.5–5.1)
Sodium: 135 mmol/L (ref 135–145)

## 2023-04-07 LAB — CBC
HCT: 42.2 % (ref 39.0–52.0)
Hemoglobin: 14 g/dL (ref 13.0–17.0)
MCH: 29 pg (ref 26.0–34.0)
MCHC: 33.2 g/dL (ref 30.0–36.0)
MCV: 87.6 fL (ref 80.0–100.0)
Platelets: 376 10*3/uL (ref 150–400)
RBC: 4.82 MIL/uL (ref 4.22–5.81)
RDW: 15.1 % (ref 11.5–15.5)
WBC: 7.2 10*3/uL (ref 4.0–10.5)
nRBC: 0 % (ref 0.0–0.2)

## 2023-04-07 LAB — GLUCOSE, CAPILLARY: Glucose-Capillary: 181 mg/dL — ABNORMAL HIGH (ref 70–99)

## 2023-04-07 LAB — TYPE AND SCREEN
ABO/RH(D): O POS
Antibody Screen: NEGATIVE

## 2023-04-07 LAB — SURGICAL PCR SCREEN
MRSA, PCR: NEGATIVE
Staphylococcus aureus: NEGATIVE

## 2023-04-07 LAB — HEMOGLOBIN A1C
Hgb A1c MFr Bld: 7.2 % — ABNORMAL HIGH (ref 4.8–5.6)
Mean Plasma Glucose: 160 mg/dL

## 2023-04-07 NOTE — Progress Notes (Signed)
PCP - Dr. Pearson G[facility]pe [facility]) Cardiologist - denies  PPM/ICD - denies   Chest x-ray - 03/31/23- CE EKG - 03/31/23- CE- tracing requested Stress Test - 10+ years ago per pt, at [place] [facility] - 06/18/16 Cardiac Cath - denies  Sleep Study - OSA+ CPAP - nightly  Fasting Blood Sugar - 80-130 Checks Blood Sugar continuously (continuous monitor)  Last dose of GLP1 agonist-  04/04/23 GLP1 instructions: Hold 7 days prior to surgery  ASA/Blood Thinner Instructions: n/a   ERAS Protcol - no, NPO   COVID TEST- n/a   Anesthesia review: no  Patient denies shortness of breath, fever, cough and chest pain at PAT appointment   All instructions explained to the patient, with a verbal understanding of the material. Patient agrees to go over the instructions while at home for a better understanding.  The opportunity to ask questions was provided.

## 2023-04-13 ENCOUNTER — Other Ambulatory Visit: Payer: Self-pay

## 2023-04-13 ENCOUNTER — Inpatient Hospital Stay (HOSPITAL_COMMUNITY)
Admission: RE | Admit: 2023-04-13 | Discharge: 2023-04-14 | DRG: 455 | Disposition: A | Discharge status: Home / Self Care | Payer: No Typology Code available for payment source | Attending: Neurosurgery | Admitting: Neurosurgery

## 2023-04-13 ENCOUNTER — Inpatient Hospital Stay (HOSPITAL_COMMUNITY): Payer: No Typology Code available for payment source

## 2023-04-13 ENCOUNTER — Encounter (HOSPITAL_COMMUNITY)
Admission: RE | Disposition: A | Discharge status: Home / Self Care | Payer: Self-pay | Source: Home / Self Care | Attending: Neurosurgery

## 2023-04-13 ENCOUNTER — Encounter (HOSPITAL_COMMUNITY): Payer: Self-pay | Admitting: Neurosurgery

## 2023-04-13 DIAGNOSIS — M4722 Other spondylosis with radiculopathy, cervical region: Secondary | ICD-10-CM | POA: Diagnosis present

## 2023-04-13 DIAGNOSIS — Z7985 Long-term (current) use of injectable non-insulin antidiabetic drugs: Secondary | ICD-10-CM | POA: Diagnosis not present

## 2023-04-13 DIAGNOSIS — I129 Hypertensive chronic kidney disease with stage 1 through stage 4 chronic kidney disease, or unspecified chronic kidney disease: Secondary | ICD-10-CM | POA: Diagnosis present

## 2023-04-13 DIAGNOSIS — Z79899 Other long term (current) drug therapy: Secondary | ICD-10-CM

## 2023-04-13 DIAGNOSIS — N183 Chronic kidney disease, stage 3 unspecified: Secondary | ICD-10-CM | POA: Diagnosis present

## 2023-04-13 DIAGNOSIS — M1990 Unspecified osteoarthritis, unspecified site: Secondary | ICD-10-CM | POA: Diagnosis present

## 2023-04-13 DIAGNOSIS — M4802 Spinal stenosis, cervical region: Secondary | ICD-10-CM | POA: Diagnosis present

## 2023-04-13 DIAGNOSIS — E119 Type 2 diabetes mellitus without complications: Secondary | ICD-10-CM | POA: Diagnosis not present

## 2023-04-13 DIAGNOSIS — Z888 Allergy status to other drugs, medicaments and biological substances status: Secondary | ICD-10-CM

## 2023-04-13 DIAGNOSIS — G4730 Sleep apnea, unspecified: Secondary | ICD-10-CM | POA: Diagnosis not present

## 2023-04-13 DIAGNOSIS — M4712 Other spondylosis with myelopathy, cervical region: Secondary | ICD-10-CM | POA: Diagnosis present

## 2023-04-13 DIAGNOSIS — Z96651 Presence of right artificial knee joint: Secondary | ICD-10-CM | POA: Diagnosis present

## 2023-04-13 DIAGNOSIS — Z794 Long term (current) use of insulin: Secondary | ICD-10-CM | POA: Diagnosis not present

## 2023-04-13 DIAGNOSIS — Z880 Allergy status to penicillin: Secondary | ICD-10-CM | POA: Diagnosis not present

## 2023-04-13 DIAGNOSIS — E1122 Type 2 diabetes mellitus with diabetic chronic kidney disease: Secondary | ICD-10-CM | POA: Diagnosis present

## 2023-04-13 DIAGNOSIS — Z885 Allergy status to narcotic agent status: Secondary | ICD-10-CM | POA: Diagnosis not present

## 2023-04-13 DIAGNOSIS — Z886 Allergy status to analgesic agent status: Secondary | ICD-10-CM

## 2023-04-13 DIAGNOSIS — I10 Essential (primary) hypertension: Secondary | ICD-10-CM

## 2023-04-13 DIAGNOSIS — Z91030 Bee allergy status: Secondary | ICD-10-CM

## 2023-04-13 HISTORY — PX: ANTERIOR CERVICAL DECOMP/DISCECTOMY FUSION: SHX1161

## 2023-04-13 LAB — GLUCOSE, CAPILLARY
Glucose-Capillary: 142 mg/dL — ABNORMAL HIGH (ref 70–99)
Glucose-Capillary: 107 mg/dL — ABNORMAL HIGH (ref 70–99)
Glucose-Capillary: 121 mg/dL — ABNORMAL HIGH (ref 70–99)
Glucose-Capillary: 171 mg/dL — ABNORMAL HIGH (ref 70–99)
Glucose-Capillary: 233 mg/dL — ABNORMAL HIGH (ref 70–99)

## 2023-04-13 LAB — ABO/RH: ABO/RH(D): O POS

## 2023-04-13 SURGERY — ANTERIOR CERVICAL DECOMPRESSION/DISCECTOMY FUSION 1 LEVEL/HARDWARE REMOVAL
Anesthesia: General | Site: Spine Cervical

## 2023-04-13 MED ORDER — PROPOFOL 10 MG/ML IV BOLUS
INTRAVENOUS | Status: DC | PRN
  Administered 2023-04-13: 200 mg via INTRAVENOUS

## 2023-04-13 MED ORDER — POLYVINYL ALCOHOL 1.4 % OP SOLN
1.0000 [drp] | Freq: Four times a day (QID) | OPHTHALMIC | Status: DC
  Administered 2023-04-13 – 2023-04-14 (×2): 1 [drp] via OPHTHALMIC
  Filled 2023-04-13: qty 15

## 2023-04-13 MED ORDER — PHENYLEPHRINE HCL-NACL 20-0.9 MG/250ML-% IV SOLN
INTRAVENOUS | Status: DC | PRN
  Administered 2023-04-13: 30 ug/min via INTRAVENOUS

## 2023-04-13 MED ORDER — HYDROXYZINE HCL 25 MG PO TABS
25.0000 mg | ORAL_TABLET | Freq: Every day | ORAL | Status: DC
  Administered 2023-04-14: 25 mg via ORAL
  Filled 2023-04-13: qty 1

## 2023-04-13 MED ORDER — BISACODYL 10 MG RE SUPP: 10.0000 mg | Freq: Every day | RECTAL | Status: DC | PRN

## 2023-04-13 MED ORDER — FENTANYL CITRATE (PF) 100 MCG/2ML IJ SOLN
25.0000 ug | INTRAMUSCULAR | Status: DC | PRN
  Administered 2023-04-13 (×3): 50 ug via INTRAVENOUS

## 2023-04-13 MED ORDER — DEXAMETHASONE SODIUM PHOSPHATE 4 MG/ML IJ SOLN
4.0000 mg | Freq: Four times a day (QID) | INTRAMUSCULAR | Status: AC
  Administered 2023-04-13: 4 mg via INTRAVENOUS
  Filled 2023-04-13: qty 1

## 2023-04-13 MED ORDER — SUGAMMADEX SODIUM 200 MG/2ML IV SOLN
INTRAVENOUS | Status: DC | PRN
  Administered 2023-04-13: 217.8 mg via INTRAVENOUS

## 2023-04-13 MED ORDER — NALOXONE HCL 4 MG/0.1ML NA LIQD: 1.0000 | NASAL | Status: DC | PRN

## 2023-04-13 MED ORDER — SPIRONOLACTONE 25 MG PO TABS
25.0000 mg | ORAL_TABLET | Freq: Every day | ORAL | Status: DC
  Administered 2023-04-14: 25 mg via ORAL
  Filled 2023-04-13: qty 1

## 2023-04-13 MED ORDER — ORAL CARE MOUTH RINSE: 15.0000 mL | Freq: Once | OROMUCOSAL | Status: AC

## 2023-04-13 MED ORDER — CYCLOBENZAPRINE HCL 10 MG PO TABS
10.0000 mg | ORAL_TABLET | Freq: Three times a day (TID) | ORAL | Status: DC | PRN
  Administered 2023-04-13 – 2023-04-14 (×2): 10 mg via ORAL
  Filled 2023-04-13 (×2): qty 1

## 2023-04-13 MED ORDER — ONDANSETRON HCL 4 MG/2ML IJ SOLN: 4.0000 mg | Freq: Four times a day (QID) | INTRAMUSCULAR | Status: DC | PRN

## 2023-04-13 MED ORDER — BUPROPION HCL ER (XL) 300 MG PO TB24
300.0000 mg | ORAL_TABLET | Freq: Every day | ORAL | Status: DC
  Administered 2023-04-14: 300 mg via ORAL
  Filled 2023-04-13: qty 1

## 2023-04-13 MED ORDER — FENTANYL CITRATE (PF) 100 MCG/2ML IJ SOLN
INTRAMUSCULAR | Status: AC
  Filled 2023-04-13: qty 2

## 2023-04-13 MED ORDER — CHLORHEXIDINE GLUCONATE 0.12 % MT SOLN
15.0000 mL | Freq: Once | OROMUCOSAL | Status: AC
  Administered 2023-04-13: 15 mL via OROMUCOSAL
  Filled 2023-04-13: qty 15

## 2023-04-13 MED ORDER — HYDROMORPHONE HCL 1 MG/ML IJ SOLN
0.5000 mg | INTRAMUSCULAR | Status: DC | PRN
  Administered 2023-04-13: 1 mg via INTRAVENOUS
  Filled 2023-04-13: qty 1

## 2023-04-13 MED ORDER — LIDOCAINE 2% (20 MG/ML) 5 ML SYRINGE
INTRAMUSCULAR | Status: DC | PRN
  Administered 2023-04-13: 60 mg via INTRAVENOUS

## 2023-04-13 MED ORDER — DOCUSATE SODIUM 100 MG PO CAPS
100.0000 mg | ORAL_CAPSULE | Freq: Two times a day (BID) | ORAL | Status: DC
  Administered 2023-04-13 – 2023-04-14 (×2): 100 mg via ORAL
  Filled 2023-04-13 (×2): qty 1

## 2023-04-13 MED ORDER — BUPIVACAINE-EPINEPHRINE (PF) 0.25% -1:200000 IJ SOLN
INTRAMUSCULAR | Status: AC
  Filled 2023-04-13: qty 30

## 2023-04-13 MED ORDER — 0.9 % SODIUM CHLORIDE (POUR BTL) OPTIME
TOPICAL | Status: DC | PRN
  Administered 2023-04-13: 1000 mL

## 2023-04-13 MED ORDER — HYDROXYZINE PAMOATE 25 MG PO CAPS: 25.0000 mg | ORAL_CAPSULE | Freq: Every day | ORAL | Status: DC

## 2023-04-13 MED ORDER — SILDENAFIL CITRATE 100 MG PO TABS: 100.0000 mg | ORAL_TABLET | Freq: Every day | ORAL | Status: DC | PRN

## 2023-04-13 MED ORDER — VANCOMYCIN HCL 1500 MG/300ML IV SOLN
1500.0000 mg | INTRAVENOUS | Status: AC
  Administered 2023-04-13 (×2): 1500 mg via INTRAVENOUS
  Filled 2023-04-13: qty 300

## 2023-04-13 MED ORDER — OXYCODONE HCL 5 MG/5ML PO SOLN: 5.0000 mg | Freq: Once | ORAL | Status: DC | PRN

## 2023-04-13 MED ORDER — BUPIVACAINE-EPINEPHRINE (PF) 0.25% -1:200000 IJ SOLN
INTRAMUSCULAR | Status: DC | PRN
  Administered 2023-04-13: 10 mL

## 2023-04-13 MED ORDER — PROPOFOL 10 MG/ML IV BOLUS
INTRAVENOUS | Status: AC
  Filled 2023-04-13: qty 20

## 2023-04-13 MED ORDER — DOXEPIN HCL 10 MG PO CAPS
10.0000 mg | ORAL_CAPSULE | Freq: Every day | ORAL | Status: DC
  Administered 2023-04-13: 10 mg via ORAL
  Filled 2023-04-13 (×2): qty 1

## 2023-04-13 MED ORDER — VANCOMYCIN HCL 1500 MG/300ML IV SOLN
1500.0000 mg | Freq: Two times a day (BID) | INTRAVENOUS | Status: AC
  Administered 2023-04-13: 1500 mg via INTRAVENOUS
  Filled 2023-04-13 (×2): qty 300

## 2023-04-13 MED ORDER — OXYCODONE HCL 5 MG PO TABS: 5.0000 mg | ORAL_TABLET | Freq: Once | ORAL | Status: DC | PRN

## 2023-04-13 MED ORDER — ONDANSETRON HCL 4 MG/2ML IJ SOLN
INTRAMUSCULAR | Status: AC
  Filled 2023-04-13: qty 2

## 2023-04-13 MED ORDER — ROCURONIUM BROMIDE 10 MG/ML (PF) SYRINGE
PREFILLED_SYRINGE | INTRAVENOUS | Status: DC | PRN
  Administered 2023-04-13: 20 mg via INTRAVENOUS
  Administered 2023-04-13: 50 mg via INTRAVENOUS
  Administered 2023-04-13: 20 mg via INTRAVENOUS

## 2023-04-13 MED ORDER — MENTHOL 3 MG MT LOZG: 1.0000 | LOZENGE | OROMUCOSAL | Status: DC | PRN

## 2023-04-13 MED ORDER — PHENOL 1.4 % MT LIQD: 1.0000 | OROMUCOSAL | Status: DC | PRN

## 2023-04-13 MED ORDER — LOSARTAN POTASSIUM 50 MG PO TABS
100.0000 mg | ORAL_TABLET | Freq: Every day | ORAL | Status: DC
  Administered 2023-04-13 – 2023-04-14 (×2): 100 mg via ORAL
  Filled 2023-04-13 (×2): qty 2

## 2023-04-13 MED ORDER — HYDROXYZINE HCL 25 MG PO TABS
50.0000 mg | ORAL_TABLET | Freq: Every day | ORAL | Status: DC
  Administered 2023-04-13: 50 mg via ORAL
  Filled 2023-04-13: qty 2

## 2023-04-13 MED ORDER — ONDANSETRON HCL 4 MG PO TABS: 4.0000 mg | ORAL_TABLET | Freq: Four times a day (QID) | ORAL | Status: DC | PRN

## 2023-04-13 MED ORDER — DEXAMETHASONE 4 MG PO TABS
4.0000 mg | ORAL_TABLET | Freq: Four times a day (QID) | ORAL | Status: AC
  Administered 2023-04-13: 4 mg via ORAL
  Filled 2023-04-13: qty 1

## 2023-04-13 MED ORDER — LACTATED RINGERS IV SOLN: INTRAVENOUS | Status: DC

## 2023-04-13 MED ORDER — CHLORHEXIDINE GLUCONATE CLOTH 2 % EX PADS: 6.0000 | MEDICATED_PAD | Freq: Once | CUTANEOUS | Status: DC

## 2023-04-13 MED ORDER — ALUM & MAG HYDROXIDE-SIMETH 200-200-20 MG/5ML PO SUSP: 30.0000 mL | Freq: Four times a day (QID) | ORAL | Status: DC | PRN

## 2023-04-13 MED ORDER — THROMBIN 5000 UNITS EX SOLR
CUTANEOUS | Status: AC
  Filled 2023-04-13: qty 5000

## 2023-04-13 MED ORDER — ACETAMINOPHEN 650 MG RE SUPP: 650.0000 mg | RECTAL | Status: DC | PRN

## 2023-04-13 MED ORDER — ROCURONIUM BROMIDE 10 MG/ML (PF) SYRINGE
PREFILLED_SYRINGE | INTRAVENOUS | Status: AC
  Filled 2023-04-13: qty 10

## 2023-04-13 MED ORDER — THROMBIN 5000 UNITS EX SOLR: OROMUCOSAL | Status: DC | PRN

## 2023-04-13 MED ORDER — PANTOPRAZOLE SODIUM 40 MG PO TBEC
40.0000 mg | DELAYED_RELEASE_TABLET | Freq: Every day | ORAL | Status: DC
  Administered 2023-04-13: 40 mg via ORAL
  Filled 2023-04-13: qty 1

## 2023-04-13 MED ORDER — CARBOXYMETHYLCELLULOSE SODIUM 0.5 % OP SOLN: 1.0000 [drp] | Freq: Four times a day (QID) | OPHTHALMIC | Status: DC

## 2023-04-13 MED ORDER — ACETAMINOPHEN 500 MG PO TABS
1000.0000 mg | ORAL_TABLET | Freq: Four times a day (QID) | ORAL | Status: DC
  Administered 2023-04-13 – 2023-04-14 (×3): 1000 mg via ORAL
  Filled 2023-04-13 (×3): qty 2

## 2023-04-13 MED ORDER — MELATONIN 5 MG PO TABS
5.0000 mg | ORAL_TABLET | Freq: Once | ORAL | Status: AC
  Administered 2023-04-13: 5 mg via ORAL
  Filled 2023-04-13: qty 1

## 2023-04-13 MED ORDER — AMLODIPINE BESYLATE 10 MG PO TABS
10.0000 mg | ORAL_TABLET | Freq: Every day | ORAL | Status: DC
  Administered 2023-04-14: 10 mg via ORAL
  Filled 2023-04-13: qty 1

## 2023-04-13 MED ORDER — BACITRACIN ZINC 500 UNIT/GM EX OINT
TOPICAL_OINTMENT | CUTANEOUS | Status: DC | PRN
  Administered 2023-04-13: 1 via TOPICAL

## 2023-04-13 MED ORDER — FLUTICASONE PROPIONATE 50 MCG/ACT NA SUSP: 1.0000 | Freq: Two times a day (BID) | NASAL | Status: DC | PRN

## 2023-04-13 MED ORDER — BUPRENORPHINE HCL 75 MCG BU FILM: 75.0000 ug | ORAL_FILM | Freq: Two times a day (BID) | BUCCAL | Status: DC

## 2023-04-13 MED ORDER — LURASIDONE HCL 40 MG PO TABS
40.0000 mg | ORAL_TABLET | Freq: Every evening | ORAL | Status: DC
  Administered 2023-04-13: 40 mg via ORAL
  Filled 2023-04-13 (×2): qty 1

## 2023-04-13 MED ORDER — LORATADINE 10 MG PO TABS: 10.0000 mg | ORAL_TABLET | Freq: Every day | ORAL | Status: DC | PRN

## 2023-04-13 MED ORDER — LIDOCAINE 2% (20 MG/ML) 5 ML SYRINGE
INTRAMUSCULAR | Status: AC
  Filled 2023-04-13: qty 5

## 2023-04-13 MED ORDER — DEXAMETHASONE SODIUM PHOSPHATE 10 MG/ML IJ SOLN
INTRAMUSCULAR | Status: DC | PRN
  Administered 2023-04-13: 5 mg via INTRAVENOUS

## 2023-04-13 MED ORDER — PANTOPRAZOLE SODIUM 40 MG IV SOLR: 40.0000 mg | Freq: Every day | INTRAVENOUS | Status: DC

## 2023-04-13 MED ORDER — FENTANYL CITRATE (PF) 250 MCG/5ML IJ SOLN
INTRAMUSCULAR | Status: AC
  Filled 2023-04-13: qty 5

## 2023-04-13 MED ORDER — BACITRACIN ZINC 500 UNIT/GM EX OINT
TOPICAL_OINTMENT | CUTANEOUS | Status: AC
  Filled 2023-04-13: qty 28.35

## 2023-04-13 MED ORDER — POLYETHYLENE GLYCOL 3350 17 G PO PACK
17.0000 g | PACK | Freq: Every day | ORAL | Status: DC
  Administered 2023-04-13: 17 g via ORAL
  Filled 2023-04-13: qty 1

## 2023-04-13 MED ORDER — MIDAZOLAM HCL 2 MG/2ML IJ SOLN
INTRAMUSCULAR | Status: DC | PRN
  Administered 2023-04-13: 2 mg via INTRAVENOUS

## 2023-04-13 MED ORDER — ACETAMINOPHEN 325 MG PO TABS
650.0000 mg | ORAL_TABLET | ORAL | Status: DC | PRN
  Filled 2023-04-13: qty 2

## 2023-04-13 MED ORDER — FENTANYL CITRATE (PF) 250 MCG/5ML IJ SOLN
INTRAMUSCULAR | Status: DC | PRN
  Administered 2023-04-13: 50 ug via INTRAVENOUS
  Administered 2023-04-13: 100 ug via INTRAVENOUS
  Administered 2023-04-13 (×3): 50 ug via INTRAVENOUS

## 2023-04-13 MED ORDER — MIDAZOLAM HCL 2 MG/2ML IJ SOLN
INTRAMUSCULAR | Status: AC
  Filled 2023-04-13: qty 2

## 2023-04-13 MED ORDER — OXYCODONE HCL 5 MG PO TABS
10.0000 mg | ORAL_TABLET | ORAL | Status: DC | PRN
  Administered 2023-04-13 – 2023-04-14 (×5): 10 mg via ORAL
  Filled 2023-04-13 (×5): qty 2

## 2023-04-13 MED ORDER — INSULIN ASPART 100 UNIT/ML IJ SOLN
0.0000 [IU] | Freq: Three times a day (TID) | INTRAMUSCULAR | Status: DC
  Administered 2023-04-13: 4 [IU] via SUBCUTANEOUS

## 2023-04-13 MED ORDER — DEXAMETHASONE SODIUM PHOSPHATE 10 MG/ML IJ SOLN
INTRAMUSCULAR | Status: AC
  Filled 2023-04-13: qty 2

## 2023-04-13 MED ORDER — LACTATED RINGERS IV SOLN: INTRAVENOUS | Status: DC | PRN

## 2023-04-13 MED ORDER — OXYCODONE HCL 5 MG PO TABS: 5.0000 mg | ORAL_TABLET | ORAL | Status: DC | PRN

## 2023-04-13 MED ORDER — INSULIN ASPART 100 UNIT/ML IJ SOLN
0.0000 [IU] | INTRAMUSCULAR | Status: DC | PRN
  Administered 2023-04-13: 2 [IU] via SUBCUTANEOUS
  Filled 2023-04-13: qty 1

## 2023-04-13 MED ORDER — ONDANSETRON HCL 4 MG/2ML IJ SOLN
INTRAMUSCULAR | Status: DC | PRN
  Administered 2023-04-13: 4 mg via INTRAVENOUS

## 2023-04-13 SURGICAL SUPPLY — 57 items
APL SKNCLS STERI-STRIP NONHPOA (GAUZE/BANDAGES/DRESSINGS) ×1
BAG COUNTER SPONGE SURGICOUNT (BAG) ×1 IMPLANT
BAG SPNG CNTER NS LX DISP (BAG) ×1
BENZOIN TINCTURE PRP APPL 2/3 (GAUZE/BANDAGES/DRESSINGS) ×2 IMPLANT
BIT DRILL NEURO 2X3.1 SFT TUCH (MISCELLANEOUS) ×1 IMPLANT
BLADE SURG 15 STRL LF DISP TIS (BLADE) ×1 IMPLANT
BLADE SURG 15 STRL SS (BLADE) ×1
BLADE ULTRA TIP 2M (BLADE) ×1 IMPLANT
BUR BARREL STRAIGHT FLUTE 4.0 (BURR) ×1 IMPLANT
BUR MATCHSTICK NEURO 3.0 LAGG (BURR) ×1 IMPLANT
CANISTER SUCT 3000ML PPV (MISCELLANEOUS) ×1 IMPLANT
COVER MAYO STAND STRL (DRAPES) ×1 IMPLANT
DRAPE LAPAROTOMY 100X72 PEDS (DRAPES) ×1 IMPLANT
DRAPE MICROSCOPE SLANT 54X150 (MISCELLANEOUS) IMPLANT
DRAPE SURG 17X23 STRL (DRAPES) ×2 IMPLANT
DRILL NEURO 2X3.1 SOFT TOUCH (MISCELLANEOUS) ×1
DRSG OPSITE POSTOP 3X4 (GAUZE/BANDAGES/DRESSINGS) ×1 IMPLANT
DRSG OPSITE POSTOP 4X6 (GAUZE/BANDAGES/DRESSINGS) IMPLANT
ELECT REM PT RETURN 9FT ADLT (ELECTROSURGICAL) ×1
ELECTRODE REM PT RTRN 9FT ADLT (ELECTROSURGICAL) ×1 IMPLANT
GAUZE 4X4 16PLY ~~LOC~~+RFID DBL (SPONGE) IMPLANT
GLOVE BIO SURGEON STRL SZ 6.5 (GLOVE) ×1 IMPLANT
GLOVE BIO SURGEON STRL SZ8 (GLOVE) ×1 IMPLANT
GLOVE BIO SURGEON STRL SZ8.5 (GLOVE) ×1 IMPLANT
GLOVE BIOGEL PI IND STRL 6.5 (GLOVE) ×1 IMPLANT
GLOVE EXAM NITRILE XL STR (GLOVE) IMPLANT
GOWN STRL REUS W/ TWL LRG LVL3 (GOWN DISPOSABLE) ×1 IMPLANT
GOWN STRL REUS W/ TWL XL LVL3 (GOWN DISPOSABLE) ×1 IMPLANT
GOWN STRL REUS W/TWL LRG LVL3 (GOWN DISPOSABLE) ×1
GOWN STRL REUS W/TWL XL LVL3 (GOWN DISPOSABLE) ×1
HEMOSTAT POWDER KIT SURGIFOAM (HEMOSTASIS) ×1 IMPLANT
KIT BASIN OR (CUSTOM PROCEDURE TRAY) ×1 IMPLANT
KIT TURNOVER KIT B (KITS) ×1 IMPLANT
MARKER SKIN DUAL TIP RULER LAB (MISCELLANEOUS) ×1 IMPLANT
NDL SPNL 18GX3.5 QUINCKE PK (NEEDLE) ×1 IMPLANT
NEEDLE HYPO 22GX1.5 SAFETY (NEEDLE) ×1 IMPLANT
NEEDLE SPNL 18GX3.5 QUINCKE PK (NEEDLE) ×1 IMPLANT
NS IRRIG 1000ML POUR BTL (IV SOLUTION) ×1 IMPLANT
PACK LAMINECTOMY NEURO (CUSTOM PROCEDURE TRAY) ×1 IMPLANT
PIN DISTRACTION 14MM (PIN) ×2 IMPLANT
PLATE SKYLINE 12MM (Plate) IMPLANT
PUTTY DBM 5CC CALC GRAN (Putty) IMPLANT
SCREW SKYLINE VAR OS 14MM (Screw) IMPLANT
SCREW VAR SELF TAP SKYLINE 14M (Screw) IMPLANT
SOL ELECTROSURG ANTI STICK (MISCELLANEOUS) ×1
SOLUTION ELECTROSURG ANTI STCK (MISCELLANEOUS) ×1 IMPLANT
SPACER CIF 8 4D SM (Spacer) IMPLANT
SPIKE FLUID TRANSFER (MISCELLANEOUS) ×1 IMPLANT
SPONGE INTESTINAL PEANUT (DISPOSABLE) ×2 IMPLANT
SPONGE SURGIFOAM ABS GEL SZ50 (HEMOSTASIS) IMPLANT
STRIP CLOSURE SKIN 1/2X4 (GAUZE/BANDAGES/DRESSINGS) ×1 IMPLANT
SUT VIC AB 0 CT1 27 (SUTURE) ×1
SUT VIC AB 0 CT1 27XBRD ANTBC (SUTURE) ×1 IMPLANT
SUT VIC AB 3-0 SH 8-18 (SUTURE) ×1 IMPLANT
TOWEL GREEN STERILE (TOWEL DISPOSABLE) ×1 IMPLANT
TOWEL GREEN STERILE FF (TOWEL DISPOSABLE) ×1 IMPLANT
WATER STERILE IRR 1000ML POUR (IV SOLUTION) ×1 IMPLANT

## 2023-04-13 NOTE — Anesthesia Procedure Notes (Signed)
Procedure Name: Intubation Date/Time: 04/13/2023 12:37 PM  Performed by: Rachel Moulds, CRNAPre-anesthesia Checklist: Patient identified, Emergency Drugs available, Suction available, Patient being monitored and Timeout performed Patient Re-evaluated:Patient Re-evaluated prior to induction Oxygen Delivery Method: Circle system utilized Preoxygenation: Pre-oxygenation with 100% oxygen Induction Type: IV induction Ventilation: Mask ventilation without difficulty and Oral airway inserted - appropriate to patient size Grade View: Grade I Tube type: Oral Tube size: 7.5 mm Number of attempts: 1 Airway Equipment and Method: Rigid stylet and Video-laryngoscopy Placement Confirmation: ETT inserted through vocal cords under direct vision, positive ETCO2, CO2 detector and breath sounds checked- equal and bilateral Secured at: 22 cm Tube secured with: Tape Dental Injury: Teeth and Oropharynx as per pre-operative assessment

## 2023-04-13 NOTE — Op Note (Signed)
Brief history: The patient is a 67 year old black male on whom I previously performed a C5-6 and C4-5 anterior cervicectomy fusion and plating.  He has done well until recently when developed recurrent neck and bilateral shoulder pain consistent with a cervical radiculopathy.  He has failed medical management and was worked up with a cervical MRI which demonstrated spondylosis and foraminal stenosis most prominent at C3-4.  I discussed the various treatment options with him.  He has decided proceed with surgery.  Preoperative diagnosis: Cervical spondylosis, cervical spinal stenosis, cervical neuroforaminal stenosis, cervical radiculopathy, cervical myelopathy, cervicalgia  Postoperative diagnosis: The same  Procedure: C3-4 anterior cervical discectomy/decompression; C3-4 interbody arthrodesis with local morcellized autograft bone and Zimmer DBM; insertion of interbody prosthesis at C3-4 (DePuy titanium interbody prosthesis); anterior cervical plating from C3-4 with DePuy titanium plate; exploration of cervical fusion/removal of cervical hardware  Surgeon: Dr. Delma Officer  Asst.: Hildred Priest, NP  Anesthesia: Gen. endotracheal  Estimated blood loss: 75 cc  Drains: None  Complications: None  Description of procedure: The patient was brought to the operating room by the anesthesia team. General endotracheal anesthesia was induced. A roll was placed under the patient's shoulders to keep the neck in the neutral position. The patient's anterior cervical region was then prepared with Betadine scrub and Betadine solution. Sterile drapes were applied.  The area to be incised was then injected with Marcaine with epinephrine solution. I then used a scalpel to make a transverse incision in the patient's left anterior neck. I used the Metzenbaum scissors to dissected through the scar tissue from the previous operation and to divide the platysmal muscle and then to dissect medial to the  sternocleidomastoid muscle, jugular vein, and carotid artery. I carefully dissected down towards the anterior cervical spine identifying the esophagus and retracting it medially. Then using Kitner swabs to clear soft tissue from the anterior cervical spine and exposing the old cervical plate at W2-9.  We explored the fusion by unlocking the cams and removed the screws from the old plate at F6-2.  We then removed the old plate.  We inspected the arthrodesis at C4-5.  It appeared solid.  I then used electrocautery to detach the medial border of the longus colli muscle bilaterally from the C3-4 intervertebral disc spaces. I then inserted the Caspar self-retaining retractor underneath the longus colli muscle bilaterally to provide exposure.  We then incised the intervertebral disc at C3-4. We then performed a partial intervertebral discectomy with a pituitary forceps and the Karlin curettes. I then inserted distraction screws into the vertebral bodies at C3, we used an old screw hole at C4. We then distracted the interspace. We then used the high-speed drill to decorticate the vertebral endplates at C3-4, to drill away the remainder of the intervertebral disc, to drill away some posterior spondylosis, and to thin out the posterior longitudinal ligament. I then incised ligament with the arachnoid knife. We then removed the ligament with a Kerrison punches undercutting the vertebral endplates and decompressing the thecal sac. We then performed foraminotomies about the bilateral C4 nerve roots. This completed the decompression at this level.  We now turned our to attention to the interbody fusion. We used the trial spacers to determine the appropriate size for the interbody prosthesis. We then pre-filled prosthesis with a combination of local morcellized autograft bone that we obtained during decompression as well as Zimmer DBM. We then inserted the prosthesis into the distracted interspace at C3-4. We then removed  the distraction screws. There was  a good snug fit of the prosthesis in the interspace.  Having completed the fusion we now turned attention to the anterior spinal instrumentation. We used the high-speed drill to drill away some anterior spondylosis at the disc spaces so that the plate lay down flat. We selected the appropriate length titanium anterior cervical plate. We laid it along the anterior aspect of the vertebral bodies from C3-4. We then drilled 14 mm holes at C3, we used the old screw holes at C4. We then secured the plate to the vertebral bodies by placing two 14 mm self-tapping screws at C3 and C4. We then obtained intraoperative radiograph. The demonstrating good position of the instrumentation. We therefore secured the screws the plate the locking each cam. This completed the instrumentation.  We then obtained hemostasis using bipolar electrocautery. We irrigated the wound out with saline solution. We then removed the retractor. We inspected the esophagus for any damage. There was none apparent. We then reapproximated patient's platysmal muscle with interrupted 3-0 Vicryl suture. We then reapproximated the subcutaneous tissue with interrupted 3-0 Vicryl suture. The skin was reapproximated with Steri-Strips and benzoin. The wound was then covered with bacitracin ointment. A sterile dressing was applied. The drapes were removed. Patient was subsequently extubated by the anesthesia team and transported to the post anesthesia care unit in stable condition. All sponge instrument and needle counts were reportedly correct at the end of this case.

## 2023-04-13 NOTE — Progress Notes (Signed)
Orthopedic Tech Progress Note Patient Details:  Ronald Atkinson 01-17-1956 [id number]  PACU RN called requesting an ASPEN CERVICAL COLLAR called in order stat to [facility]   Patient ID: Ronald Atkinson, male   DOB: 07-Apr-1956, 67 y.o.   MRN: [id number]  Ronald Atkinson 04/13/2023, 4:37 PM

## 2023-04-13 NOTE — Progress Notes (Signed)
Pharmacy Antibiotic Note  Ronald Atkinson is a 67 y.o. male admitted on 04/13/2023 with surgical prophylaxis.  Pharmacy has been consulted for vanco dosing.  Plan: No drains noted in post-op note Give vanco 1500 mg iv x1 dose at 2330 Pharmacy will sign off consult  Height: 5\' 10"  (177.8 cm) Weight: 108.9 kg (240 lb) IBW/kg (Calculated) : 73  Temp (24hrs), Avg:98.3 F (36.8 C), Min:98 F (36.7 C), Max:98.5 F (36.9 C)  Recent Labs  Lab 04/07/23 0835  WBC 7.2  CREATININE 1.84*    Estimated Creatinine Clearance: 49.5 mL/min (A) (by C-G formula based on SCr of 1.84 mg/dL (H)).    Allergies  Allergen Reactions   Bee Venom Anaphylaxis and Hives   Penicillins Shortness Of Breath, Rash and Other (See Comments)   Diazepam     Dizziness and syncope   Doxycycline Rash   Empagliflozin Other (See Comments)    Acidosis, Dehydration    Gabapentin     Dizziness and syncope   Nsaids     Avoid due to kidney disease    Morphine Rash    Antimicrobials this admission: vanc 6/5 >> 6/5   Thank you for allowing pharmacy to be a part of this patient's care.  [name] BS, PharmD, BCPS Clinical Pharmacist 04/13/2023 4:38 PM  Contact: [phone] after 3 PM  "Be curious, not judgmental..." -Debbora Dus

## 2023-04-13 NOTE — Anesthesia Preprocedure Evaluation (Signed)
Anesthesia Evaluation  Patient identified by MRN, date of birth, ID band Patient awake    Reviewed: Allergy & Precautions, H&P , NPO status , Patient's Chart, lab work & pertinent test results  Airway Mallampati: III   Neck ROM: limited    Dental   Pulmonary sleep apnea    breath sounds clear to auscultation       Cardiovascular hypertension,  Rhythm:regular Rate:Normal     Neuro/Psych  Headaches PSYCHIATRIC DISORDERS Anxiety Depression Bipolar Disorder      GI/Hepatic   Endo/Other  diabetes, Type 2    Renal/GU Renal InsufficiencyRenal disease     Musculoskeletal  (+) Arthritis ,    Abdominal   Peds  Hematology   Anesthesia Other Findings   Reproductive/Obstetrics                             Anesthesia Physical Anesthesia Plan  ASA: 3  Anesthesia Plan: General   Post-op Pain Management:    Induction: Intravenous  PONV Risk Score and Plan: 2 and Ondansetron, Dexamethasone, Midazolam and Treatment may vary due to age or medical condition  Airway Management Planned: Oral ETT and Video Laryngoscope Planned  Additional Equipment:   Intra-op Plan:   Post-operative Plan: Extubation in OR  Informed Consent: I have reviewed the patients History and Physical, chart, labs and discussed the procedure including the risks, benefits and alternatives for the proposed anesthesia with the patient or authorized representative who has indicated his/her understanding and acceptance.     Dental advisory given  Plan Discussed with: CRNA, Anesthesiologist and Surgeon  Anesthesia Plan Comments:        Anesthesia Quick Evaluation

## 2023-04-13 NOTE — Transfer of Care (Signed)
Immediate Anesthesia Transfer of Care Note  Patient: Ronald Atkinson  Procedure(s) Performed: CERVICAL THREE-FOUR ANTERIOR CERVICAL DECOMPRESSION/DISCETOMY FUSION WITH REMOVAL OF PLATE (Spine Cervical)  Patient Location: PACU  Anesthesia Type:General  Level of Consciousness: awake, alert , and oriented  Airway & Oxygen Therapy: Patient Spontanous Breathing and Patient connected to nasal cannula oxygen  Post-op Assessment: Report given to RN and Post -op Vital signs reviewed and stable  Post vital signs: Reviewed and stable  Last Vitals:  Vitals Value Taken Time  BP 149/88   Temp 36.7 C 04/13/23 1525  Pulse 91 04/13/23 1525  Resp 12   SpO2 94     Last Pain:  Vitals:   04/13/23 1117  TempSrc:   PainSc: 9       Patients Stated Pain Goal: 1 (04/13/23 1117)  Complications: No notable events documented.

## 2023-04-13 NOTE — H&P (Signed)
Subjective: The patient is a 67 year old black male on whom I previously performed a C4-5, C5-6 and C6-7 anterior cervical discectomy fusion and plating.  He has developed recurrent neck and arm pain and numbness, left greater right, consistent with a cervical radiculopathy.  He has failed medical management and was worked up with a cervical MRI which demonstrated spondylosis and stenosis most prominent at C3-4.  I discussed the various treatment options with him.  He has decided proceed with surgery.  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)   ANTERIOR CERVICAL DECOMP/DISCECTOMY FUSION N/A 08/12/2014   Procedure: Cervical four/five anterior cervical decompression with fusion interbody prosthesis plating and bonegraft with removal of old plate;  Surgeon: Tressie Stalker, MD;  Location: [facility];  Service: Neurosurgery;  Laterality: N/A;   ANTERIOR CERVICAL DECOMP/DISCECTOMY FUSION  2002   CARPAL TUNNEL RELEASE     both wrists   EYE SURGERY     bil cataracts   I & D EXTREMITY Right 10/11/2017   Procedure: IRRIGATION AND DEBRIDEMENT RIGHT KNEE WITH WOUND CLOSURE;  Surgeon: Beverely Low, MD;  Location: [facility] OR;  Service: Orthopedics;  Laterality: Right;   TOTAL KNEE ARTHROPLASTY Right 10/07/2017   Procedure: RIGHT TOTAL KNEE ARTHROPLASTY;  Surgeon: Beverely Low, MD;  Location: [facility] OR;  Service: Orthopedics;  Laterality: Right;   ULNAR NERVE REPAIR Bilateral    right shoulder and neck (both arms)    Allergies  Allergen Reactions   Bee Venom Anaphylaxis and Hives   Penicillins  Shortness Of Breath, Rash and Other (See Comments)   Diazepam     Dizziness and syncope   Doxycycline Rash   Empagliflozin Other (See Comments)    Acidosis, Dehydration    Gabapentin     Dizziness and syncope   Nsaids     Avoid due to kidney disease    Morphine Rash    Social History   Tobacco Use   Smoking status: Never   Smokeless tobacco: Never  Substance Use Topics   Alcohol use: No    History reviewed. No pertinent family history. Prior to Admission medications   Medication Sig Start Date End Date Taking? Authorizing Provider  amLODipine (NORVASC) 10 MG tablet Take 10 mg by mouth daily.   Yes [provider]  Buprenorphine HCl 75 MCG FILM Place 75 mcg inside cheek 2 (two) times daily.   Yes [provider]  buPROPion (WELLBUTRIN XL) 300 MG 24 hr tablet Take 300 mg by mouth daily.   Yes [provider]  carboxymethylcellulose (REFRESH PLUS) 0.5 % SOLN Place 1 drop into both eyes 4 (four) times daily.   Yes [provider]  cholecalciferol (VITAMIN D3) 25 MCG (1000 UNIT) tablet Take 1,000 Units by mouth daily.   Yes [provider]  dextrose (GLUTOSE) 40 % GEL Take 1 Tube by mouth once as needed for low blood sugar.   Yes [provider]  diclofenac Sodium (VOLTAREN) 1 % GEL Apply 1  Application topically 3 (three) times daily.   Yes [provider]  doxepin (SINEQUAN) 10 MG capsule Take 10 mg by mouth at bedtime.   Yes [provider]  fluticasone (FLONASE) 50 MCG/ACT nasal spray Place 1 spray 2 (two) times daily as needed into both nostrils for allergies or rhinitis.   Yes [provider]  glucose 4 GM chewable tablet Chew 4 tablets by mouth as needed for low blood sugar.   Yes [provider]  hydrOXYzine (VISTARIL) 25 MG capsule Take 25-50 mg by mouth See admin instructions. Take 25 mg in the morning and 50 mg in the evening   Yes [provider]  insulin aspart (NOVOLOG  FLEXPEN) 100 UNIT/ML FlexPen Inject 6-10 Units into the skin See admin instructions. Inject 6 units with breakfast, 8 units with lunch, 10 units with dinner   Yes [provider]  insulin glargine (LANTUS) 100 UNIT/ML injection Inject 22 Units into the skin daily.   Yes [provider]  loratadine (CLARITIN) 10 MG tablet Take 10 mg daily as needed by mouth for allergies.   Yes [provider]  losartan (COZAAR) 100 MG tablet Take 100 mg by mouth daily.   Yes [provider]  lurasidone (LATUDA) 40 MG TABS tablet Take 40 mg every evening by mouth.   Yes [provider]  Maca Root (MACA PO) Take 1 capsule by mouth daily.   Yes [provider]  Magnesium Oxide 420 MG TABS Take 420 mg 2 (two) times daily by mouth.   Yes [provider]  naloxone (NARCAN) nasal spray 4 mg/0.1 mL Place 1 spray into the nose as needed (opioid overdose).   Yes [provider]  nutrition supplement, JUVEN, (JUVEN) PACK Take 1 packet by mouth 2 (two) times daily between meals.   Yes [provider]  OVER THE COUNTER MEDICATION Take 3 tablets by mouth daily. Red wood supplement   Yes [provider]  OVER THE COUNTER MEDICATION Take 2 tablets by mouth daily. Sea moss supplement   Yes [provider]  Semaglutide (OZEMPIC, 0.25 OR 0.5 MG/DOSE, [place]) Inject 0.25 mg into the skin once a week.   Yes [provider]  spironolactone (ALDACTONE) 25 MG tablet Take 25 mg by mouth daily.   Yes [provider]  Continuous Blood Gluc Sensor MISC 1 each by Does not apply route as directed. Use as directed every 10 days. May dispense FreeStyle [employer] or similar. 10/21/17   Dixon, Katharina Caper, PA-C  polyethylene glycol (MIRALAX / GLYCOLAX) 17 g packet Take 17 g by mouth daily.    [provider]  sildenafil (VIAGRA) 100 MG tablet Take 100 mg by mouth daily as needed for erectile dysfunction.    [provider]     Review of Systems  Positive ROS: As above  All other systems have been reviewed and were otherwise negative with the exception of those mentioned in the HPI and as above.  Objective: Vital signs in last 24 hours: Temp:  [98.5 F (36.9 C)] 98.5 F (36.9 C) (06/05 1047) Pulse Rate:  [84] 84 (06/05 1047) Resp:  [18] 18 (06/05 1047) BP: (159)/(77) 159/77 (06/05 1047) SpO2:  [99 %] 99 % (06/05 1047) Weight:  [108.9 kg] 108.9 kg (06/05 1047) Estimated body mass index is 34.44 kg/m as calculated from the following:   Height as of this encounter: 5\' 10"  (1.778 m).   Weight as of this encounter: 108.9 kg.  General Appearance: Alert Head: Normocephalic, without obvious abnormality, atraumatic Eyes: PERRL, conjunctiva/corneas clear, EOM's intact,    Ears: Normal  Throat: Normal  Neck: The patient's anterior cervical incision is well-healed.  He has limited cervical range of motion. Back: unremarkable Lungs: Clear to auscultation bilaterally, respirations unlabored Heart: Regular rate and rhythm, no murmur, rub or gallop Abdomen: Soft, non-tender Extremities: Extremities normal, atraumatic, no cyanosis or edema Skin: unremarkable  NEUROLOGIC:   Mental status: alert and oriented,Motor Exam - grossly normal Sensory Exam - grossly normal Reflexes:  Coordination - grossly normal Gait - grossly normal Balance - grossly normal Cranial Nerves: I: smell Not tested  II: visual acuity  OS: Normal  OD: Normal   II: visual fields Full to confrontation  II: pupils Equal, round, reactive to light  III,VII: ptosis None  III,IV,VI: extraocular muscles  Full ROM  V: mastication Normal  V: facial light touch sensation  Normal  V,VII: corneal reflex  Present  VII: facial muscle function - upper  Normal  VII: facial muscle function - lower Normal  VIII: hearing Not tested  IX: soft palate elevation  Normal  IX,X: gag reflex Present  XI: trapezius strength  5/5  XI:  sternocleidomastoid strength 5/5  XI: neck flexion strength  5/5  XII: tongue strength  Normal    Data Review Lab Results  Component Value Date   WBC 7.2 04/07/2023   HGB 14.0 04/07/2023   HCT 42.2 04/07/2023   MCV 87.6 04/07/2023   PLT 376 04/07/2023   Lab Results  Component Value Date   NA 135 04/07/2023   K 4.7 04/07/2023   CL 101 04/07/2023   CO2 26 04/07/2023   BUN 16 04/07/2023   CREATININE 1.84 (H) 04/07/2023   GLUCOSE 155 (H) 04/07/2023   No results found for: "INR", "PROTIME"  Assessment/Plan: Cervical spondylosis, cervical radiculopathy, cervicalgia: I have discussed the situation with the patient.  I reviewed his imaging studies with him and pointed out the abnormalities.  We have discussed the various treatment options including surgery.  I have described the surgical treatment option of a C3-4 anterior cervicectomy fusion plating and likely removal his old plate at Z6-1.  I have shown him surgical models.  I have given him a surgical pamphlet.  We have discussed the risk, benefits, alternatives, expected postop course, and likelihood of achieving our goals with surgery.  I have answered all the patient's questions.  He has decided proceed with surgery.   Cristi Loron 04/13/2023 11:49 AM

## 2023-04-14 LAB — GLUCOSE, CAPILLARY: Glucose-Capillary: 163 mg/dL — ABNORMAL HIGH (ref 70–99)

## 2023-04-14 MED ORDER — OXYCODONE-ACETAMINOPHEN 5-325 MG PO TABS: 1.0000 | ORAL_TABLET | ORAL | 0 refills | Status: AC | PRN

## 2023-04-14 MED ORDER — DOCUSATE SODIUM 100 MG PO CAPS: 100.0000 mg | ORAL_CAPSULE | Freq: Two times a day (BID) | ORAL | 0 refills | Status: AC

## 2023-04-14 MED ORDER — CYCLOBENZAPRINE HCL 10 MG PO TABS: 10.0000 mg | ORAL_TABLET | Freq: Three times a day (TID) | ORAL | 0 refills | Status: AC | PRN

## 2023-04-14 MED ORDER — INSULIN ASPART 100 UNIT/ML IJ SOLN
0.0000 [IU] | Freq: Three times a day (TID) | INTRAMUSCULAR | Status: DC
  Administered 2023-04-14: 4 [IU] via SUBCUTANEOUS

## 2023-04-14 NOTE — Progress Notes (Signed)
Patient alert and oriented, voiding adequately, skin clean, dry and intact without evidence of skin break down, or symptoms of complications - no redness or edema noted, only slight tenderness at site.  Patient states pain is manageable at time of discharge. Patient has an appointment with MD in 3 weeks 

## 2023-04-14 NOTE — Discharge Instructions (Signed)
Wound Care Keep incision covered and dry until post op day 3. You may remove the Honeycomb dressing on post op day 3. Leave steri-strips on incision.  They will fall off by themselves. Do not put any creams, lotions, or ointments on incision. You are fine to shower. Let water run over incision and pat dry.  Activity Walk each and every day, increasing distance each day. No lifting greater than 5 lbs.  Avoid excessive neck motion. No driving for 2 weeks; may ride as a passenger locally.  Diet Resume your normal diet.   Return to Work Will be discussed at your follow up appointment.  Call Your Doctor If Any of These Occur Redness, drainage, or swelling at the wound.  Temperature greater than 101 degrees. Severe pain not relieved by pain medication. Incision starts to come apart.  Follow Up Appt Call [phone] today for appointment in 2-3 weeks if you don't already have one or for any problems.  If you have any hardware placed in your spine, you will need an x-ray before your appointment.

## 2023-04-14 NOTE — Plan of Care (Signed)

## 2023-04-14 NOTE — Evaluation (Signed)
Occupational Therapy Evaluation Patient Details Name: Ronald Atkinson MRN: [id number] DOB: 07/24/1956 Today's Date: 04/14/2023   History of Present Illness 67 yo M s/p ACDF.  PMH includes: HTN, OSA, CPAP, DM, Prior ACDF.   Clinical Impression   Patient admitted for the procedure above.  PTA he lives at home, and needed no assist with ADL, iADL and mobility.  Currently he is very close to baseline, and is up and walking the halls a-lib.  All precautions reviewed, ADL completed and no further OT needs in the acute setting.  Recommend follow up with MD as prescribed.        Recommendations for follow up therapy are one component of a multi-disciplinary discharge planning process, led by the attending physician.  Recommendations may be updated based on patient status, additional functional criteria and insurance authorization.   Assistance Recommended at Discharge Set up Supervision/Assistance  Patient can return home with the following Assist for transportation    Functional Status Assessment  Patient has not had a recent decline in their functional status  Equipment Recommendations  None recommended by OT    Recommendations for Other Services       Precautions / Restrictions Precautions Precautions: Cervical Precaution Booklet Issued: Yes (comment) Precaution Comments: verbalized understanding Required Braces or Orthoses: Cervical Brace Cervical Brace: Hard collar;For comfort Restrictions Weight Bearing Restrictions: No      Mobility Bed Mobility Overal bed mobility: Modified Independent                  Transfers Overall transfer level: Independent                        Balance Overall balance assessment: Mild deficits observed, not formally tested                                         ADL either performed or assessed with clinical judgement   ADL Overall ADL's : At baseline                                              Vision Patient Visual Report: No change from baseline       Perception     Praxis      Pertinent Vitals/Pain Pain Assessment Pain Assessment: Faces Faces Pain Scale: Hurts a little bit Pain Location: cervical Pain Descriptors / Indicators: Tender Pain Intervention(s): Monitored during session     Hand Dominance Right   Extremity/Trunk Assessment Upper Extremity Assessment Upper Extremity Assessment: Overall WFL for tasks assessed   Lower Extremity Assessment Lower Extremity Assessment: Overall WFL for tasks assessed   Cervical / Trunk Assessment Cervical / Trunk Assessment: Neck Surgery   Communication Communication Communication: No difficulties   Cognition Arousal/Alertness: Awake/alert Behavior During Therapy: WFL for tasks assessed/performed Overall Cognitive Status: Within Functional Limits for tasks assessed                                       General Comments   VSS on RA    Exercises     Shoulder Instructions      Home Living Family/patient expects to be discharged to:: Private residence Living  Arrangements: Alone Available Help at Discharge: Family;Available PRN/intermittently;Friend(s) Type of Home: House Home Access: Level entry     Home Layout: One level     Bathroom Shower/Tub: Chief Strategy Officer: Standard Bathroom Accessibility: Yes How Accessible: Accessible via walker Home Equipment: Rolling Walker (2 wheels);BSC/3in1;Toilet riser;Cane - single point          Prior Functioning/Environment Prior Level of Function : Independent/Modified Independent;Driving                        OT Problem List: Pain      OT Treatment/Interventions: Balance training    OT Goals(Current goals can be found in the care plan section) Acute Rehab OT Goals Patient Stated Goal: Return home OT Goal Formulation: With patient Time For Goal Achievement: 04/18/23 Potential to Achieve Goals: Good  OT  Frequency:      Co-evaluation              AM-PAC OT "6 Clicks" Daily Activity     Outcome Measure Help from another person eating meals?: None Help from another person taking care of personal grooming?: None Help from another person toileting, which includes using toliet, bedpan, or urinal?: None Help from another person bathing (including washing, rinsing, drying)?: None Help from another person to put on and taking off regular upper body clothing?: None Help from another person to put on and taking off regular lower body clothing?: None 6 Click Score: 24   End of Session Nurse Communication: Mobility status  Activity Tolerance: Patient tolerated treatment well Patient left: in bed;with call bell/phone within reach  OT Visit Diagnosis: Muscle weakness (generalized) (M62.81)                Time: 1610-9604 OT Time Calculation (min): 25 min Charges:  OT General Charges $OT Visit: 1 Visit OT Evaluation $OT Eval Moderate Complexity: 1 Mod OT Treatments $Self Care/Home Management : 8-22 mins  04/14/2023  RP, OTR/L  Acute Rehabilitation Services  Office:  [phone]   Suzanna Obey 04/14/2023, 9:28 AM

## 2023-04-14 NOTE — Discharge Summary (Signed)
Physician Discharge Summary     Providing Compassionate, Quality Care - Together   Patient ID: Ronald Atkinson MRN: [id number] DOB/AGE: August 05, 1956 67 y.o.  Admit date: 04/13/2023 Discharge date: 04/14/2023  Admission Diagnoses: Cervical spondylosis with myelopathy and radiculopathy  Discharge Diagnoses:  Principal Problem:   Cervical spondylosis with myelopathy and radiculopathy   Discharged Condition: good  Hospital Course: Patient underwent a C3-4 ACDF by Dr. Lovell Sheehan on 04/13/2023. He was admitted to 3C02 following recovery from anesthesia in the PACU. His postoperative course has been uncomplicated. He has worked with both physical and occupational therapies who feel the patient is ready for discharge home. He is ambulating independently and without difficulty. He is tolerating a normal diet. He is not having any bowel or bladder dysfunction. His pain is well-controlled with oral pain medication. He is ready for discharge home.   Consults: None  Significant Diagnostic Studies: radiology: DG Cervical Spine 1 View  Result Date: 04/13/2023 CLINICAL DATA:  Elective surgery. EXAM: DG CERVICAL SPINE - 1 VIEW COMPARISON:  Radiograph 03/15/2023 FINDINGS: Portable cross-table lateral view of the cervical spine obtained in the operating room. There is new anterior fusion hardware at C3-C4 with interbody spacer in place. There is also an previous interbody spacer at C4-C5. Previous screw within the lower cervical spine is not seen on the current exam. IMPRESSION: Intraoperative cross-table lateral view of the cervical spine. Electronically Signed   By: Narda Rutherford M.D.   On: 04/13/2023 16:49     Treatments: surgery:  C3-4 anterior cervical discectomy/decompression; C3-4 interbody arthrodesis with local morcellized autograft bone and Zimmer DBM; insertion of interbody prosthesis at C3-4 (DePuy titanium interbody prosthesis); anterior cervical plating from C3-4 with DePuy titanium plate;  exploration of cervical fusion/removal of cervical hardware   Discharge Exam: Blood pressure (!) 147/79, pulse 79, temperature (!) 97.5 F (36.4 C), temperature source Oral, resp. rate 16, height 5\' 10"  (1.778 m), weight 108.9 kg, SpO2 99 %.  Alert and oriented x 4 PERRLA CN II-XII grossly intact MAE, Strength and sensation intact Incision is covered with Honeycomb dressing and Steri Strips; Dressing is clean, dry, and intact   Disposition:    Allergies as of 04/14/2023       Reactions   Bee Venom Anaphylaxis, Hives   Penicillins Shortness Of Breath, Rash, Other (See Comments)   Diazepam    Dizziness and syncope   Doxycycline Rash   Empagliflozin Other (See Comments)   Acidosis, Dehydration   Gabapentin    Dizziness and syncope   Nsaids    Avoid due to kidney disease    Morphine Rash        Medication List     TAKE these medications    amLODipine 10 MG tablet Commonly known as: NORVASC Take 10 mg by mouth daily.   Buprenorphine HCl 75 MCG Film Place 75 mcg inside cheek 2 (two) times daily.   buPROPion 300 MG 24 hr tablet Commonly known as: WELLBUTRIN XL Take 300 mg by mouth daily.   carboxymethylcellulose 0.5 % Soln Commonly known as: REFRESH PLUS Place 1 drop into both eyes 4 (four) times daily.   cholecalciferol 25 MCG (1000 UNIT) tablet Commonly known as: VITAMIN D3 Take 1,000 Units by mouth daily.   Continuous Blood Gluc Sensor Misc 1 each by Does not apply route as directed. Use as directed every 10 days. May dispense FreeStyle [employer] or similar.   cyclobenzaprine 10 MG tablet Commonly known as: FLEXERIL Take 1 tablet (10 mg  total) by mouth 3 (three) times daily as needed for muscle spasms.   dextrose 40 % Gel Commonly known as: GLUTOSE Take 1 Tube by mouth once as needed for low blood sugar.   glucose 4 GM chewable tablet Chew 4 tablets by mouth as needed for low blood sugar.   diclofenac Sodium 1 % Gel Commonly known as:  VOLTAREN Apply 1 Application topically 3 (three) times daily.   docusate sodium 100 MG capsule Commonly known as: COLACE Take 1 capsule (100 mg total) by mouth 2 (two) times daily.   doxepin 10 MG capsule Commonly known as: SINEQUAN Take 10 mg by mouth at bedtime.   fluticasone 50 MCG/ACT nasal spray Commonly known as: FLONASE Place 1 spray 2 (two) times daily as needed into both nostrils for allergies or rhinitis.   hydrOXYzine 25 MG capsule Commonly known as: VISTARIL Take 25-50 mg by mouth See admin instructions. Take 25 mg in the morning and 50 mg in the evening   insulin glargine 100 UNIT/ML injection Commonly known as: LANTUS Inject 22 Units into the skin daily.   loratadine 10 MG tablet Commonly known as: CLARITIN Take 10 mg daily as needed by mouth for allergies.   losartan 100 MG tablet Commonly known as: COZAAR Take 100 mg by mouth daily.   lurasidone 40 MG Tabs tablet Commonly known as: LATUDA Take 40 mg every evening by mouth.   MACA PO Take 1 capsule by mouth daily.   Magnesium Oxide 420 MG Tabs Take 420 mg 2 (two) times daily by mouth.   Narcan 4 MG/0.1ML Liqd nasal spray kit Generic drug: naloxone Place 1 spray into the nose as needed (opioid overdose).   NovoLOG FlexPen 100 UNIT/ML FlexPen Generic drug: insulin aspart Inject 6-10 Units into the skin See admin instructions. Inject 6 units with breakfast, 8 units with lunch, 10 units with dinner   nutrition supplement (JUVEN) Pack Take 1 packet by mouth 2 (two) times daily between meals.   OVER THE COUNTER MEDICATION Take 3 tablets by mouth daily. Red wood supplement   OVER THE COUNTER MEDICATION Take 2 tablets by mouth daily. Sea moss supplement   oxyCODONE-acetaminophen 5-325 MG tablet Commonly known as: Percocet Take 1 tablet by mouth every 4 (four) hours as needed for severe pain (Postoperative pain).   OZEMPIC (0.25 OR 0.5 MG/DOSE) [place] Inject 0.25 mg into the skin once a week.    polyethylene glycol 17 g packet Commonly known as: MIRALAX / GLYCOLAX Take 17 g by mouth daily.   sildenafil 100 MG tablet Commonly known as: VIAGRA Take 100 mg by mouth daily as needed for erectile dysfunction.   spironolactone 25 MG tablet Commonly known as: ALDACTONE Take 25 mg by mouth daily.        Follow-up Information     Tressie Stalker, MD. Go on 05/06/2023.   Specialty: Neurosurgery Why: First post op appointment with x-ray is on 05/06/2023 at 12:45 PM. Contact information: [address] [place] [address] [phone]                 Signed: Val Eagle, DNP, AGNP-C Nurse Practitioner  [facility] [address], [address], [place], [place] [address] P: [phone]    F: [phone]  04/14/2023, 10:33 AM

## 2023-04-18 NOTE — Anesthesia Postprocedure Evaluation (Signed)
Anesthesia Post Note  Patient: Ronald Atkinson  Procedure(s) Performed: CERVICAL THREE-FOUR ANTERIOR CERVICAL DECOMPRESSION/DISCETOMY FUSION WITH REMOVAL OF PLATE (Spine Cervical)     Patient location during evaluation: PACU Anesthesia Type: General Level of consciousness: awake and alert Pain management: pain level controlled Vital Signs Assessment: post-procedure vital signs reviewed and stable Respiratory status: spontaneous breathing, nonlabored ventilation, respiratory function stable and patient connected to nasal cannula oxygen Cardiovascular status: blood pressure returned to baseline and stable Postop Assessment: no apparent nausea or vomiting Anesthetic complications: no   No notable events documented.  Last Vitals:  Vitals:   04/14/23 0258 04/14/23 0714  BP: 135/74 (!) 147/79  Pulse: 70 79  Resp: 18 16  Temp: 36.7 C (!) 36.4 C  SpO2: 96% 99%    Last Pain:  Vitals:   04/14/23 0929  TempSrc:   PainSc: 4                  [name]

## 2023-04-22 ENCOUNTER — Encounter (HOSPITAL_COMMUNITY): Payer: Self-pay | Admitting: Neurosurgery

## 2023-04-25 ENCOUNTER — Encounter (HOSPITAL_COMMUNITY): Payer: Self-pay | Admitting: Neurosurgery

## 2023-06-22 ENCOUNTER — Encounter: Payer: Self-pay | Admitting: Emergency Medicine

## 2023-06-22 ENCOUNTER — Ambulatory Visit (INDEPENDENT_AMBULATORY_CARE_PROVIDER_SITE_OTHER): Payer: No Typology Code available for payment source | Admitting: Emergency Medicine

## 2023-06-22 VITALS — BP 118/78 | HR 78 | Resp 16 | Ht 69.25 in | Wt 226.0 lb

## 2023-06-22 DIAGNOSIS — R918 Other nonspecific abnormal finding of lung field: Secondary | ICD-10-CM | POA: Diagnosis not present

## 2023-06-22 LAB — PSA: PSA: 7.46 ng/mL — ABNORMAL HIGH (ref 0.10–4.00)

## 2023-06-22 NOTE — Patient Instructions (Addendum)
We will check blood work today We will arrange for a PET scan We will arrange for navigational bronchoscopy to evaluate pulmonary nodule seen on your CT scan of the chest.  This will be done under general anesthesia as an outpatient at [facility] endoscopy.  You will need a designated driver and someone to watch you that day after the procedure.  We will try to get this scheduled for either 07/04/2023 or 07/18/2023. Followed with T Parrett in 1 month Follow Dr. Delton Coombes in 3 months or sooner if you have problems.

## 2023-06-22 NOTE — Progress Notes (Signed)
Subjective:    Patient ID: Ronald Atkinson, male    DOB: 1956-10-20, 67 y.o.   MRN: [id number]  HPI 67 year old former smoker (40+ pk/yrs) with a history of diabetes, hypertension, chronic kidney disease, obstructive sleep apnea, bipolar disorder. He had a folliculitis and was hospitalized 2020, ? Bacteremia. Then 2 yrs ago, ? Another episode bacteremia. He is referred today for pulmonary nodules seen on CT chest. This was done at the [facility] 05/11/2023. Looks like he had an abdominal CT 03/31/2023 at [facility] for nausea vomiting that identified multiple pulmonary nodules at the bases, largest 9 mm.  Etiology was unclear. No CP, no breathing difficulty. He has some intermittent cough.    Review of Systems As per HPI  Past Medical History:  Diagnosis Date   Anxiety    Arthritis    Bipolar disorder (HCC)    Depression    Diabetes mellitus without complication (HCC)    takes pills and insulin  -  dx 2008   Headache(784.0)    cluster headaches   Hypertension    Kidney disease, chronic, stage III (GFR 30-59 ml/min) (HCC)    Sleep apnea    tested 2015     Family History  Problem Relation Age of Onset   Diabetes Mother    Diabetes Father   No CA  Social History   Socioeconomic History   Marital status: Single    Spouse name: Not on file   Number of children: 4   Years of education: Not on file   Highest education level: Not on file  Occupational History   Not on file  Tobacco Use   Smoking status: Former    Types: Cigarettes    Passive exposure: Never   Smokeless tobacco: Never  Vaping Use   Vaping status: Never Used  Substance and Sexual Activity   Alcohol use: No   Drug use: Yes    Types: Marijuana    Comment: smokes marijuana every day   Sexual activity: Not on file  Other Topics Concern   Not on file  Social History Narrative   Not on file   Social Determinants of Health   Financial Resource Strain: Not on file  Food Insecurity: Not on file  Transportation  Needs: No Transportation Needs (07/11/2021)   Received from [facility]   Transportation Needs    In the past 12 months, has lack of transportation kept you from medical appointments or from getting medications?: 2    In the past 12 months, has lack of transportation kept you from meetings, work, or from getting things needed for daily living?: 2  Physical Activity: Not on file  Stress: Not on file  Social Connections: Unknown (04/27/2022)   Received from [facility], [facility]   Social Network    Social Network: Not on file  Intimate Partner Violence: Not At Risk (04/30/2023)   Received from [facility], [facility]   HITS    Over the last 12 months how often did your partner physically hurt you?: 1    Over the last 12 months how often did your partner insult you or talk down to you?: 1    Over the last 12 months how often did your partner threaten you with physical harm?: 1    Over the last 12 months how often did your partner scream or curse at you?: 1    Was in the [employer], was in [place], [place], [place] Has worked [employer], Materials engineer  Allergies  Allergen Reactions   Bee Venom Anaphylaxis and Hives   Penicillins Shortness Of Breath, Rash and Other (See Comments)   Diazepam     Dizziness and syncope   Doxycycline Rash   Empagliflozin Other (See Comments)    Acidosis, Dehydration    Semaglutide Nausea And Vomiting   Gabapentin     Dizziness and syncope   Nsaids     Avoid due to kidney disease    Morphine Rash     Outpatient Medications Prior to Visit  Medication Sig Dispense Refill   amLODipine (NORVASC) 10 MG tablet Take 10 mg by mouth daily.     Buprenorphine HCl 75 MCG FILM Place 75 mcg inside cheek 2 (two) times daily.     buPROPion (WELLBUTRIN XL) 300 MG 24 hr tablet Take 300 mg by mouth daily.     carboxymethylcellulose (REFRESH PLUS) 0.5 % SOLN Place 1 drop into both eyes 4 (four) times daily.     cholecalciferol (VITAMIN D3) 25 MCG (1000 UNIT)  tablet Take 1,000 Units by mouth daily.     Continuous Blood Gluc Sensor MISC 1 each by Does not apply route as directed. Use as directed every 10 days. May dispense FreeStyle [employer] or similar. 3 each 0   cyclobenzaprine (FLEXERIL) 10 MG tablet Take 1 tablet (10 mg total) by mouth 3 (three) times daily as needed for muscle spasms. 30 tablet 0   dextrose (GLUTOSE) 40 % GEL Take 1 Tube by mouth once as needed for low blood sugar.     diclofenac Sodium (VOLTAREN) 1 % GEL Apply 1 Application topically 3 (three) times daily.     docusate sodium (COLACE) 100 MG capsule Take 1 capsule (100 mg total) by mouth 2 (two) times daily. 10 capsule 0   doxepin (SINEQUAN) 10 MG capsule Take 10 mg by mouth at bedtime.     fluticasone (FLONASE) 50 MCG/ACT nasal spray Place 1 spray 2 (two) times daily as needed into both nostrils for allergies or rhinitis.     glucose 4 GM chewable tablet Chew 4 tablets by mouth as needed for low blood sugar.     hydrOXYzine (VISTARIL) 25 MG capsule Take 25-50 mg by mouth See admin instructions. Take 25 mg in the morning and 50 mg in the evening     insulin aspart (NOVOLOG FLEXPEN) 100 UNIT/ML FlexPen Inject 6-10 Units into the skin See admin instructions. Inject 6 units with breakfast, 8 units with lunch, 10 units with dinner     insulin glargine (LANTUS) 100 UNIT/ML injection Inject 22 Units into the skin daily.     loratadine (CLARITIN) 10 MG tablet Take 10 mg daily as needed by mouth for allergies.     lurasidone (LATUDA) 40 MG TABS tablet Take 40 mg every evening by mouth.     Maca Root (MACA PO) Take 1 capsule by mouth daily.     Magnesium Oxide 420 MG TABS Take 420 mg 2 (two) times daily by mouth.     naloxone (NARCAN) nasal spray 4 mg/0.1 mL Place 1 spray into the nose as needed (opioid overdose).     nutrition supplement, JUVEN, (JUVEN) PACK Take 1 packet by mouth 2 (two) times daily between meals.     OVER THE COUNTER MEDICATION Take 3 tablets by mouth daily.  Red wood supplement     OVER THE COUNTER MEDICATION Take 2 tablets by mouth daily. Sea moss supplement     oxyCODONE-acetaminophen (PERCOCET) 5-325 MG tablet Take  1 tablet by mouth every 4 (four) hours as needed for severe pain (Postoperative pain). 20 tablet 0   polyethylene glycol (MIRALAX / GLYCOLAX) 17 g packet Take 17 g by mouth daily.     sildenafil (VIAGRA) 100 MG tablet Take 100 mg by mouth daily as needed for erectile dysfunction.     spironolactone (ALDACTONE) 25 MG tablet Take 25 mg by mouth daily.     losartan (COZAAR) 100 MG tablet Take 100 mg by mouth daily.     Semaglutide (OZEMPIC, 0.25 OR 0.5 MG/DOSE, [place]) Inject 0.25 mg into the skin once a week.     No facility-administered medications prior to visit.         Objective:   Physical Exam  Vitals:   06/22/23 1259  BP: 118/78  Pulse: 78  Resp: 16  SpO2: 98%  Weight: 226 lb (102.5 kg)  Height: 5' 9.25" (1.759 m)    Gen: Pleasant, well-nourished, in no distress,  normal affect  ENT: No lesions,  mouth clear,  oropharynx clear, no postnasal drip  Neck: No JVD, no stridor  Lungs: No use of accessory muscles, no crackles or wheezing on normal respiration, no wheeze on forced expiration  Cardiovascular: RRR, heart sounds normal, no murmur or gallops, no peripheral edema  Musculoskeletal: No deformities, no cyanosis or clubbing  Neuro: alert, awake, non focal  Skin: Warm, no lesions or rash     Assessment & Plan:  Pulmonary nodules/lesions, multiple Multiple rounded scattered pulmonary nodules of various sizes concerning for possible metastatic disease or embolic process.  He has a history of bacteremia but this was remote, treated most recently in 2022.  Could consider endocarditis but less likely.  I will check a CEA, PSA.  He will undergo PET scan and I will arrange for navigational bronchoscopy to evaluate his nodules and achieve a tissue diagnosis.  We will check blood work today We will arrange for a PET  scan We will arrange for navigational bronchoscopy to evaluate pulmonary nodule seen on your CT scan of the chest.  This will be done under general anesthesia as an outpatient at [facility] endoscopy.  You will need a designated driver and someone to watch you that day after the procedure.  We will try to get this scheduled for either 07/04/2023 or 07/18/2023. Followed with T Parrett in 1 month Follow Dr. Delton Coombes in 3 months or sooner if you have problems.   Levy Pupa, MD, PhD 06/22/2023, 4:51 PM [facility] [phone] or if no answer before 7:00PM call [phone] For any issues after 7:00PM please call eLink [phone]

## 2023-06-22 NOTE — Assessment & Plan Note (Signed)
Multiple rounded scattered pulmonary nodules of various sizes concerning for possible metastatic disease or embolic process.  He has a history of bacteremia but this was remote, treated most recently in 2022.  Could consider endocarditis but less likely.  I will check a CEA, PSA.  He will undergo PET scan and I will arrange for navigational bronchoscopy to evaluate his nodules and achieve a tissue diagnosis.  We will check blood work today We will arrange for a PET scan We will arrange for navigational bronchoscopy to evaluate pulmonary nodule seen on your CT scan of the chest.  This will be done under general anesthesia as an outpatient at [facility] endoscopy.  You will need a designated driver and someone to watch you that day after the procedure.  We will try to get this scheduled for either 07/04/2023 or 07/18/2023. Followed with T Parrett in 1 month Follow Dr. Delton Coombes in 3 months or sooner if you have problems.

## 2023-06-22 NOTE — H&P (View-Only) (Signed)
 Subjective:    Patient ID: Ronald Atkinson, male    DOB: 1956-10-20, 67 y.o.   MRN: [id number]  HPI 67 year old former smoker (40+ pk/yrs) with a history of diabetes, hypertension, chronic kidney disease, obstructive sleep apnea, bipolar disorder. He had a folliculitis and was hospitalized 2020, ? Bacteremia. Then 2 yrs ago, ? Another episode bacteremia. He is referred today for pulmonary nodules seen on CT chest. This was done at the [facility] 05/11/2023. Looks like he had an abdominal CT 03/31/2023 at [facility] for nausea vomiting that identified multiple pulmonary nodules at the bases, largest 9 mm.  Etiology was unclear. No CP, no breathing difficulty. He has some intermittent cough.    Review of Systems As per HPI  Past Medical History:  Diagnosis Date   Anxiety    Arthritis    Bipolar disorder (HCC)    Depression    Diabetes mellitus without complication (HCC)    takes pills and insulin  -  dx 2008   Headache(784.0)    cluster headaches   Hypertension    Kidney disease, chronic, stage III (GFR 30-59 ml/min) (HCC)    Sleep apnea    tested 2015     Family History  Problem Relation Age of Onset   Diabetes Mother    Diabetes Father   No CA  Social History   Socioeconomic History   Marital status: Single    Spouse name: Not on file   Number of children: 4   Years of education: Not on file   Highest education level: Not on file  Occupational History   Not on file  Tobacco Use   Smoking status: Former    Types: Cigarettes    Passive exposure: Never   Smokeless tobacco: Never  Vaping Use   Vaping status: Never Used  Substance and Sexual Activity   Alcohol use: No   Drug use: Yes    Types: Marijuana    Comment: smokes marijuana every day   Sexual activity: Not on file  Other Topics Concern   Not on file  Social History Narrative   Not on file   Social Determinants of Health   Financial Resource Strain: Not on file  Food Insecurity: Not on file  Transportation  Needs: No Transportation Needs (07/11/2021)   Received from [facility]   Transportation Needs    In the past 12 months, has lack of transportation kept you from medical appointments or from getting medications?: 2    In the past 12 months, has lack of transportation kept you from meetings, work, or from getting things needed for daily living?: 2  Physical Activity: Not on file  Stress: Not on file  Social Connections: Unknown (04/27/2022)   Received from [facility], [facility]   Social Network    Social Network: Not on file  Intimate Partner Violence: Not At Risk (04/30/2023)   Received from [facility], [facility]   HITS    Over the last 12 months how often did your partner physically hurt you?: 1    Over the last 12 months how often did your partner insult you or talk down to you?: 1    Over the last 12 months how often did your partner threaten you with physical harm?: 1    Over the last 12 months how often did your partner scream or curse at you?: 1    Was in the [employer], was in [place], [place], [place] Has worked [employer], Materials engineer  Allergies  Allergen Reactions   Bee Venom Anaphylaxis and Hives   Penicillins Shortness Of Breath, Rash and Other (See Comments)   Diazepam     Dizziness and syncope   Doxycycline Rash   Empagliflozin Other (See Comments)    Acidosis, Dehydration    Semaglutide Nausea And Vomiting   Gabapentin     Dizziness and syncope   Nsaids     Avoid due to kidney disease    Morphine Rash     Outpatient Medications Prior to Visit  Medication Sig Dispense Refill   amLODipine (NORVASC) 10 MG tablet Take 10 mg by mouth daily.     Buprenorphine HCl 75 MCG FILM Place 75 mcg inside cheek 2 (two) times daily.     buPROPion (WELLBUTRIN XL) 300 MG 24 hr tablet Take 300 mg by mouth daily.     carboxymethylcellulose (REFRESH PLUS) 0.5 % SOLN Place 1 drop into both eyes 4 (four) times daily.     cholecalciferol (VITAMIN D3) 25 MCG (1000 UNIT)  tablet Take 1,000 Units by mouth daily.     Continuous Blood Gluc Sensor MISC 1 each by Does not apply route as directed. Use as directed every 10 days. May dispense FreeStyle [employer] or similar. 3 each 0   cyclobenzaprine (FLEXERIL) 10 MG tablet Take 1 tablet (10 mg total) by mouth 3 (three) times daily as needed for muscle spasms. 30 tablet 0   dextrose (GLUTOSE) 40 % GEL Take 1 Tube by mouth once as needed for low blood sugar.     diclofenac Sodium (VOLTAREN) 1 % GEL Apply 1 Application topically 3 (three) times daily.     docusate sodium (COLACE) 100 MG capsule Take 1 capsule (100 mg total) by mouth 2 (two) times daily. 10 capsule 0   doxepin (SINEQUAN) 10 MG capsule Take 10 mg by mouth at bedtime.     fluticasone (FLONASE) 50 MCG/ACT nasal spray Place 1 spray 2 (two) times daily as needed into both nostrils for allergies or rhinitis.     glucose 4 GM chewable tablet Chew 4 tablets by mouth as needed for low blood sugar.     hydrOXYzine (VISTARIL) 25 MG capsule Take 25-50 mg by mouth See admin instructions. Take 25 mg in the morning and 50 mg in the evening     insulin aspart (NOVOLOG FLEXPEN) 100 UNIT/ML FlexPen Inject 6-10 Units into the skin See admin instructions. Inject 6 units with breakfast, 8 units with lunch, 10 units with dinner     insulin glargine (LANTUS) 100 UNIT/ML injection Inject 22 Units into the skin daily.     loratadine (CLARITIN) 10 MG tablet Take 10 mg daily as needed by mouth for allergies.     lurasidone (LATUDA) 40 MG TABS tablet Take 40 mg every evening by mouth.     Maca Root (MACA PO) Take 1 capsule by mouth daily.     Magnesium Oxide 420 MG TABS Take 420 mg 2 (two) times daily by mouth.     naloxone (NARCAN) nasal spray 4 mg/0.1 mL Place 1 spray into the nose as needed (opioid overdose).     nutrition supplement, JUVEN, (JUVEN) PACK Take 1 packet by mouth 2 (two) times daily between meals.     OVER THE COUNTER MEDICATION Take 3 tablets by mouth daily.  Red wood supplement     OVER THE COUNTER MEDICATION Take 2 tablets by mouth daily. Sea moss supplement     oxyCODONE-acetaminophen (PERCOCET) 5-325 MG tablet Take  1 tablet by mouth every 4 (four) hours as needed for severe pain (Postoperative pain). 20 tablet 0   polyethylene glycol (MIRALAX / GLYCOLAX) 17 g packet Take 17 g by mouth daily.     sildenafil (VIAGRA) 100 MG tablet Take 100 mg by mouth daily as needed for erectile dysfunction.     spironolactone (ALDACTONE) 25 MG tablet Take 25 mg by mouth daily.     losartan (COZAAR) 100 MG tablet Take 100 mg by mouth daily.     Semaglutide (OZEMPIC, 0.25 OR 0.5 MG/DOSE, [place]) Inject 0.25 mg into the skin once a week.     No facility-administered medications prior to visit.         Objective:   Physical Exam  Vitals:   06/22/23 1259  BP: 118/78  Pulse: 78  Resp: 16  SpO2: 98%  Weight: 226 lb (102.5 kg)  Height: 5' 9.25" (1.759 m)    Gen: Pleasant, well-nourished, in no distress,  normal affect  ENT: No lesions,  mouth clear,  oropharynx clear, no postnasal drip  Neck: No JVD, no stridor  Lungs: No use of accessory muscles, no crackles or wheezing on normal respiration, no wheeze on forced expiration  Cardiovascular: RRR, heart sounds normal, no murmur or gallops, no peripheral edema  Musculoskeletal: No deformities, no cyanosis or clubbing  Neuro: alert, awake, non focal  Skin: Warm, no lesions or rash     Assessment & Plan:  Pulmonary nodules/lesions, multiple Multiple rounded scattered pulmonary nodules of various sizes concerning for possible metastatic disease or embolic process.  He has a history of bacteremia but this was remote, treated most recently in 2022.  Could consider endocarditis but less likely.  I will check a CEA, PSA.  He will undergo PET scan and I will arrange for navigational bronchoscopy to evaluate his nodules and achieve a tissue diagnosis.  We will check blood work today We will arrange for a PET  scan We will arrange for navigational bronchoscopy to evaluate pulmonary nodule seen on your CT scan of the chest.  This will be done under general anesthesia as an outpatient at [facility] endoscopy.  You will need a designated driver and someone to watch you that day after the procedure.  We will try to get this scheduled for either 07/04/2023 or 07/18/2023. Followed with T Parrett in 1 month Follow Dr. Delton Coombes in 3 months or sooner if you have problems.   Levy Pupa, MD, PhD 06/22/2023, 4:51 PM [facility] [phone] or if no answer before 7:00PM call [phone] For any issues after 7:00PM please call eLink [phone]

## 2023-06-23 LAB — CEA: CEA: <2 ng/mL

## 2023-06-30 ENCOUNTER — Encounter (HOSPITAL_COMMUNITY): Payer: Self-pay | Admitting: Emergency Medicine

## 2023-06-30 ENCOUNTER — Other Ambulatory Visit: Payer: Self-pay

## 2023-06-30 NOTE — Progress Notes (Signed)
Spoke with pt for pre-op call. Pt has hx of HTN, Diabetes and Stage 3 CKD. Pt was admitted to [facility] on 06/24/23. 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 Pt was started on Eliquis at that time. He states he has not been told to hold it.   Pt was at a funeral when I called him, but he took the call. I reviewed his history and told him I would call him and leave the pre-op instructions on his phone. Instructed pt to take 1/2 of his regular dose of Lantus insulin morning of his procedure. He will take 11 units that day and he will not take his Novolog insulin the day of surgery.   Pt's last A1C was 7.3 on 06/24/23, he states his fasting blood sugar is usually between 79 and 117. Instructed him to check his blood sugar when he wakes up and every 2 hours until he leaves for is surgery.

## 2023-07-01 NOTE — Progress Notes (Signed)
Anesthesia Chart Review: Ronald Atkinson   Case: [id number] Date/Time: 07/04/23 1130   Procedure: ROBOTIC ASSISTED NAVIGATIONAL BRONCHOSCOPY (Bilateral)   Anesthesia type: General   Pre-op diagnosis: bilateral pulmonary nodules   [facility] 3 / [facility]   Surgeons: Leslye Peer, MD       DISCUSSION: Patient is a 67 year old male scheduled for the above procedure.  History includes former smoker, HTN, DM2, CKD (stage 3), OSA (uses CPAP), Bipolar disorder, PVD (left great toe amputation), spinal surgery (C5-7 ACDF ~ 2002; C4-5 ACDF and removal of plating C5-7 08/12/14; C3-4 ACDF 04/13/23), osteoarthritis (right TKA 10/07/17)  Evaluated by Dr. Delton Coombes on 06/22/23 after 05/11/23 CT chest from the [facility] showed multiple pulmonary nodules of various sizes concerning for possible metastatic disease or embolic process. Above procedure recommended. PET Scan also ordered (scheduled for 07/07/23).   Since then, Mr. 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 He was started on IV Heparin and then transitioned to Eliquis. "Initially there was concern for hypercoagulable state causing the renal vein thrombosis. However further review of patient's records revealed that patient had evidence of significant proteinuria. Renal vein thrombosis is associated with nephrotic range proteinuria. 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 There is limited data regarding use of direct oral anticoagulants with renal vein thrombosis but given the fact that direct oral anticoagulants have been approved in other similar scenarios it was felt that a direct oral anticoagulant would be appropriate compared to use of warfarin. Patient was therefore transitioned over to Eliquis."  Advised follow-up with nephrology.  I have communicated recent admission event to Dr. Delton Coombes. He will look into further to determine best timing for procedure. If felt case should proceed as scheduled, he will need to provide anti-coagulation instructions.   VS:  BP Readings from Last 3 Encounters:  06/22/23 118/78  04/14/23 (!) 147/79  04/07/23 135/76   Pulse Readings from Last 3 Encounters:  06/22/23 78  04/14/23 79  04/07/23 73     PROVIDERS: Clinic, [facility]   LABS: Lab results as of 06/24/23-06/26/23 ([facility]) include: WBC 10.0, hemoglobin 13.6, hematocrit 40.3, platelet count 367, sodium 133, potassium 3.6, chloride 95, CO2 26, glucose 93, BUN 22, creatinine 2.06, EGFR 35, A1c 7.3% - Most recent Creatinine trends: 2.14 03/31/23, 1.84 03/28/23,  1.65 04/30/23, 1.92 06/24/23, 1.78 06/24/23, 2.07 06/26/23 ([facility])   IMAGES: US Renal 06/24/23 ([facility]; new since 06/09/23): IMPRESSION:  - The left renal vein is not well visualized, and findings are concerning for possible thrombus.  - The left kidney is also enlarged.  - There is no evidence of hydronephrosis.   CT Abd/pelvis 06/24/23 ([facility]): Impression:  1. Infiltrative abnormality of the left kidney with imaging features most reminiscent of acute pyelonephritis. However, there are enlarging pulmonary nodules within the visualized lower lungs that raises suspicion for superimposed underlying neoplasm.  2.  Asymmetric expansion of the left renal vein with apparent filling defect extending to the left aspect of the vena cava, representing possible left renal vein thrombosis. Renal ultrasound may be able to further evaluate.  3.  Ancillary findings discussed above.   CT Thorax 05/11/23 [facility]): Impression: 1. Multiple bilateral pulmonary nodules measure up to 0.8 cm.  Metastatic disease  cannot be excluded however patient has history  of multiple lung nodules. Comparison with any previous chest CT  recommended.  Oncology consultation recommended if indicated  clinically.    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   CV: BLE Venous US 06/25/23 ([facility]): IMPRESSION:  No evidence of deep venous thrombosis.   Stress Echo 06/18/16 [facility]): Done as part of pre-operative evaluation prior to right TKA. Full report is not currently available. EKG portion is scanned under Media tab, and was "Negative study for ischemia by EKG criteria."   Past Medical History:  Diagnosis Date   Anxiety    Arthritis    Bipolar disorder (HCC)    Depression    Diabetes mellitus without complication (HCC)    takes pills and insulin  -  dx 2008   Headache(784.0)    cluster headaches   Hypertension    Kidney disease, chronic, stage III (GFR 30-59 ml/min) (HCC)    Peripheral vascular disease (HCC)    Pneumonia    Sleep apnea    tested 2015   uses cpap sometimes    Past Surgical History:  Procedure Laterality Date   AMPUTATION Left    left great toes ([artial amputation)   ANTERIOR CERVICAL DECOMP/DISCECTOMY FUSION N/A 08/12/2014   Procedure: Cervical four/five anterior cervical decompression with fusion interbody prosthesis plating and bonegraft with removal of old plate;  Surgeon: Tressie Stalker, MD;  [facility];  Service: Neurosurgery;  Laterality: N/A;   ANTERIOR CERVICAL DECOMP/DISCECTOMY FUSION  2002   ANTERIOR CERVICAL DECOMP/DISCECTOMY FUSION N/A 04/13/2023   Procedure: CERVICAL THREE-FOUR ANTERIOR CERVICAL DECOMPRESSION/DISCETOMY FUSION WITH REMOVAL OF PLATE;  Surgeon: Tressie Stalker, MD;  Location[facility] OR;  Service: Neurosurgery;  Laterality: N/A;  3C   CARPAL TUNNEL RELEASE     both wrists   EYE SURGERY     bil cataracts   I &  D EXTREMITY Right 10/11/2017   Procedure: IRRIGATION AND DEBRIDEMENT RIGHT KNEE WITH WOUND CLOSURE;  Surgeon: Beverely Low, MD;  Location: [facility] OR;  Service: Orthopedics;  Laterality: Right;   TOTAL KNEE ARTHROPLASTY Right 10/07/2017   Procedure: RIGHT TOTAL KNEE ARTHROPLASTY;  Surgeon: Beverely Low, MD;  Location: [facility] OR;  Service: Orthopedics;  Laterality: Right;   ULNAR NERVE REPAIR Bilateral    right shoulder and neck (both arms)    MEDICATIONS: No current facility-administered medications for this encounter.    Apixaban (ELIQUIS PO)   amLODipine (NORVASC) 10 MG tablet   Buprenorphine HCl 75 MCG FILM   buPROPion (WELLBUTRIN XL) 300 MG 24 hr tablet   carboxymethylcellulose (REFRESH PLUS) 0.5 % SOLN   cholecalciferol (VITAMIN D3) 25 MCG (1000 UNIT) tablet   Continuous Blood Gluc Sensor MISC   cyclobenzaprine (FLEXERIL) 10 MG tablet   dextrose (GLUTOSE) 40 % GEL   diclofenac Sodium (VOLTAREN) 1 % GEL   docusate sodium (COLACE) 100 MG capsule   doxepin (SINEQUAN) 10 MG capsule   fluticasone (FLONASE) 50 MCG/ACT nasal spray   glucose 4 GM chewable tablet   hydrOXYzine (VISTARIL) 25 MG capsule   insulin aspart (NOVOLOG FLEXPEN) 100 UNIT/ML FlexPen   insulin glargine (LANTUS) 100 UNIT/ML injection   loratadine (CLARITIN) 10 MG tablet   lurasidone (LATUDA) 40 MG TABS tablet   Maca Root (MACA PO)  Magnesium Oxide 420 MG TABS   naloxone (NARCAN) nasal spray 4 mg/0.1 mL   nutrition supplement, JUVEN, (JUVEN) PACK   OVER THE COUNTER MEDICATION   OVER THE COUNTER MEDICATION   oxyCODONE-acetaminophen (PERCOCET) 5-325 MG tablet   polyethylene glycol (MIRALAX / GLYCOLAX) 17 g packet   sildenafil (VIAGRA) 100 MG tablet   spironolactone (ALDACTONE) 25 MG tablet   He reporting hold Buprenorphine for previous surgeries, so he plans to hold a for ~ 2-3 days prior to procedure.   Shonna Chock, PA-C Surgical Short Stay/Anesthesiology [facility] Phone [phone] [facility] Phone [phone] 07/01/2023 11:19 AM

## 2023-07-01 NOTE — Anesthesia Preprocedure Evaluation (Signed)
Anesthesia Evaluation  Patient identified by MRN, date of birth, ID band Patient awake    Reviewed: Allergy & Precautions, NPO status , Patient's Chart, lab work & pertinent test results  Airway Mallampati: II  TM Distance: >3 FB Neck ROM: Limited    Dental  (+) Teeth Intact, Dental Advisory Given   Pulmonary sleep apnea , former smoker bilateral pulmonary nodules   Pulmonary exam normal breath sounds clear to auscultation       Cardiovascular hypertension, Pt. on medications + Peripheral Vascular Disease  Normal cardiovascular exam Rhythm:Regular Rate:Normal     Neuro/Psych  Headaches PSYCHIATRIC DISORDERS Anxiety Depression Bipolar Disorder   S/p ACDF  Neuromuscular disease    GI/Hepatic negative GI ROS, Neg liver ROS,,,  Endo/Other  diabetes, Type 2, Insulin Dependent    Renal/GU Renal InsufficiencyRenal disease     Musculoskeletal negative musculoskeletal ROS (+)    Abdominal   Peds  Hematology  (+) Blood dyscrasia (Eliquis)   Anesthesia Other Findings Day of surgery medications reviewed with the patient.  Reproductive/Obstetrics                              Anesthesia Physical Anesthesia Plan  ASA: 3  Anesthesia Plan: General   Post-op Pain Management: Tylenol PO (pre-op)*   Induction: Intravenous  PONV Risk Score and Plan: 2 and Dexamethasone and Ondansetron  Airway Management Planned: Oral ETT and Video Laryngoscope Planned  Additional Equipment:   Intra-op Plan:   Post-operative Plan: Extubation in OR  Informed Consent: I have reviewed the patients History and Physical, chart, labs and discussed the procedure including the risks, benefits and alternatives for the proposed anesthesia with the patient or authorized representative who has indicated his/her understanding and acceptance.     Dental advisory given  Plan Discussed with: CRNA  Anesthesia Plan  Comments: (See PAT note written 07/01/2023 by Shonna Chock, PA-C.)        Anesthesia Quick Evaluation

## 2023-07-01 NOTE — Progress Notes (Signed)
Called pt again this AM to make sure he had gotten the pre-op instructions that I left on his voicemail. He states he has not listened to them yet. I encouraged him to check the voicemail. I also instructed him to call Dr. Kavin Leech office about stopping his Eliquis. I gave him Dr. Kavin Leech office number. Pt is on Buprenorphine, I told him he would need to call the provider that prescribes the medication about holding it prior to the procedure. He states he normally stops it prior to surgery and will hold it as of now.

## 2023-07-04 ENCOUNTER — Encounter (HOSPITAL_COMMUNITY): Payer: Self-pay | Admitting: Vascular Surgery

## 2023-07-04 ENCOUNTER — Encounter (HOSPITAL_COMMUNITY)
Admission: RE | Disposition: A | Discharge status: Home / Self Care | Payer: Self-pay | Source: Home / Self Care | Attending: Emergency Medicine

## 2023-07-04 ENCOUNTER — Ambulatory Visit (HOSPITAL_COMMUNITY)
Admission: RE | Admit: 2023-07-04 | Discharge: 2023-07-04 | Disposition: A | Discharge status: Home / Self Care | Payer: No Typology Code available for payment source | Attending: Emergency Medicine | Admitting: Emergency Medicine

## 2023-07-04 ENCOUNTER — Encounter (HOSPITAL_COMMUNITY): Payer: Self-pay | Admitting: Emergency Medicine

## 2023-07-04 DIAGNOSIS — I129 Hypertensive chronic kidney disease with stage 1 through stage 4 chronic kidney disease, or unspecified chronic kidney disease: Secondary | ICD-10-CM | POA: Insufficient documentation

## 2023-07-04 DIAGNOSIS — Z7901 Long term (current) use of anticoagulants: Secondary | ICD-10-CM | POA: Diagnosis not present

## 2023-07-04 DIAGNOSIS — R918 Other nonspecific abnormal finding of lung field: Secondary | ICD-10-CM | POA: Diagnosis present

## 2023-07-04 DIAGNOSIS — Z87891 Personal history of nicotine dependence: Secondary | ICD-10-CM | POA: Diagnosis not present

## 2023-07-04 DIAGNOSIS — N183 Chronic kidney disease, stage 3 unspecified: Secondary | ICD-10-CM | POA: Insufficient documentation

## 2023-07-04 DIAGNOSIS — F319 Bipolar disorder, unspecified: Secondary | ICD-10-CM | POA: Diagnosis not present

## 2023-07-04 DIAGNOSIS — G4733 Obstructive sleep apnea (adult) (pediatric): Secondary | ICD-10-CM | POA: Diagnosis not present

## 2023-07-04 DIAGNOSIS — E1122 Type 2 diabetes mellitus with diabetic chronic kidney disease: Secondary | ICD-10-CM | POA: Diagnosis not present

## 2023-07-04 DIAGNOSIS — R911 Solitary pulmonary nodule: Secondary | ICD-10-CM | POA: Diagnosis present

## 2023-07-04 DIAGNOSIS — Z539 Procedure and treatment not carried out, unspecified reason: Secondary | ICD-10-CM | POA: Diagnosis not present

## 2023-07-04 HISTORY — DX: Peripheral vascular disease, unspecified: I73.9

## 2023-07-04 HISTORY — DX: Pneumonia, unspecified organism: J18.9

## 2023-07-04 SURGERY — BRONCHOSCOPY, WITH BIOPSY USING ELECTROMAGNETIC NAVIGATION
Anesthesia: General | Laterality: Bilateral

## 2023-07-04 MED ORDER — CHLORHEXIDINE GLUCONATE 0.12 % MT SOLN
OROMUCOSAL | Status: AC
  Filled 2023-07-04: qty 15

## 2023-07-04 MED ORDER — ACETAMINOPHEN 500 MG PO TABS
1000.0000 mg | ORAL_TABLET | Freq: Once | ORAL | Status: DC
  Filled 2023-07-04 (×2): qty 2

## 2023-07-04 NOTE — Interval H&P Note (Signed)
History and Physical Interval Note:  07/04/2023 10:09 AM  Ronald Atkinson  has presented today for surgery, with the diagnosis of bilateral pulmonary nodules.  The various methods of treatment have been discussed with the patient and family. After consideration of risks, benefits and other options for treatment, the patient has consented to  Procedure(s): ROBOTIC ASSISTED NAVIGATIONAL BRONCHOSCOPY (Bilateral) as a surgical intervention.  The patient's history has been reviewed, patient examined.  He took his Eliquis, last dose last night 8/25 in the PM.  Discussed with him that there is some increased risk of bleeding due to transbronchial biopsies since he did not hold for 2 days total.  He understands this and agrees to proceed.  There have been no changes in status, stable for surgery.  I have reviewed the patient's chart and labs.  Questions were answered to the patient's satisfaction.     Leslye Peer

## 2023-07-04 NOTE — Progress Notes (Signed)
Dr. Delton Coombes cancelled case d/t pt not stopping Eliquis in time for procedure. Pt stated last dose was 07/03/23 at 1800. PIV removed and pt d/c'd home with brother. All concerns addressed at this time.   Viviano Simas, RN

## 2023-07-07 ENCOUNTER — Ambulatory Visit (HOSPITAL_COMMUNITY)
Admission: RE | Admit: 2023-07-07 | Discharge: 2023-07-07 | Disposition: A | Discharge status: Home / Self Care | Payer: No Typology Code available for payment source | Source: Ambulatory Visit | Attending: Emergency Medicine | Admitting: Emergency Medicine

## 2023-07-07 DIAGNOSIS — R918 Other nonspecific abnormal finding of lung field: Secondary | ICD-10-CM | POA: Insufficient documentation

## 2023-07-07 LAB — GLUCOSE, CAPILLARY: Glucose-Capillary: 170 mg/dL — ABNORMAL HIGH (ref 70–99)

## 2023-07-07 MED ORDER — FLUDEOXYGLUCOSE F - 18 (FDG) INJECTION
10.5000 | Freq: Once | INTRAVENOUS | Status: AC
  Administered 2023-07-07: 10.7 via INTRAVENOUS

## 2023-07-11 ENCOUNTER — Telehealth: Payer: Self-pay | Admitting: Emergency Medicine

## 2023-07-11 DIAGNOSIS — N2889 Other specified disorders of kidney and ureter: Secondary | ICD-10-CM

## 2023-07-11 NOTE — Telephone Encounter (Signed)
Reviewed the PET scan and discussed with the patient.  Unfortunately shows a left renal mass with left renal vein involvement.  He had been recently diagnosed with a left renal vein thrombosis but unclear that the mass lesion had been identified.  The mass is hypermetabolic and there are also numerous osseous metastases, pulmonary nodules as we had already identified.  We were planning for navigational bronchoscopy to be done 07/18/2023.  Based on this result I will review films with interventional radiology, determine whether needle biopsy of the renal mass is possible.

## 2023-07-11 NOTE — Telephone Encounter (Signed)
Discussed PET scan with Dr. Deanne Coffer with interventional radiology.  We will plan to perform a core needle biopsy of the left renal mass.  I will postpone the bronchoscopy for now.  I will place an order for an ultrasound-guided core needle biopsy.  The patient is on Eliquis and will need to stop it at least 2 days prior.  Final instructions per the IR team.

## 2023-07-14 ENCOUNTER — Telehealth: Payer: Self-pay | Admitting: Emergency Medicine

## 2023-07-14 NOTE — Telephone Encounter (Signed)
Patient received a call this morning about upcoming procedure but was still half asleep and can't remember much about the conversation. He also can't remember the day of his surgery.Please call and advise [phone].

## 2023-07-15 ENCOUNTER — Encounter: Payer: Self-pay | Admitting: General Practice

## 2023-07-15 NOTE — Progress Notes (Unsigned)
Oley Balm, MD  Caroleen Hamman, NT PROCEDURE / BIOPSY REVIEW Date: 07/15/23  Requested Biopsy site: LLP renal mass Reason for request: r/o CA Imaging review: Best seen on PET 07/07/23  Decision: Approved Imaging modality to perform: CT Schedule with: Moderate Sedation Schedule for: Any VIR  Additional comments:   Please contact me with questions, concerns, or if issue pertaining to this request arise.  Dayne Oley Balm, MD [facility] Radiology       Previous Messages    ----- Message ----- From: Caroleen Hamman, NT Sent: 07/12/2023  10:33 AM EDT To: Ir Procedure Requests Subject: [place] CORE BIOPSY (SOFT TISSUE)                  Procedure: [place] CORE BIOPSY (SOFT TISSUE)  Reason: left renal mass Dx: Renal mass, left  History: NM in chart  Provider: Leslye Peer, MD  Contact: [phone]  Order Note: Patient needs [place] core bx of L renal mass. Discussed with Dr Deanne Coffer to review films who agrees. Patient is on Eliquis, will need to hold prior to procedure

## 2023-07-18 ENCOUNTER — Ambulatory Visit (HOSPITAL_COMMUNITY)
Admission: RE | Admit: 2023-07-18 | Payer: No Typology Code available for payment source | Source: Home / Self Care | Admitting: Emergency Medicine

## 2023-07-18 ENCOUNTER — Encounter (HOSPITAL_COMMUNITY): Admission: RE | Payer: Self-pay | Source: Home / Self Care

## 2023-07-18 ENCOUNTER — Encounter: Payer: Self-pay | Admitting: General Practice

## 2023-07-18 SURGERY — BRONCHOSCOPY, WITH BIOPSY USING ELECTROMAGNETIC NAVIGATION
Anesthesia: General

## 2023-07-18 NOTE — Progress Notes (Unsigned)
Leslye Peer, MD  Caroleen Hamman, NT Yes you can Thanks, Jasmine Awe       Previous Messages    ----- Message ----- From: Caroleen Hamman, NT Sent: 07/15/2023   4:33 PM EDT To: Leslye Peer, MD Subject: Blood Thinner Hold                            Good Afternoon I have the above pt scheduled for his Biopsy on 9/13 and he will need to hold his Eliquis for 48 hrs prior to procedure. Can we have your permission to hold this medication please?  Thank you Donna Christen Centralized Scheduling

## 2023-07-20 NOTE — Telephone Encounter (Signed)
Patient had procedure on 07/18/23 and has FU scheduled.

## 2023-07-21 ENCOUNTER — Ambulatory Visit: Payer: Non-veteran care | Admitting: Adult Health

## 2023-07-21 ENCOUNTER — Other Ambulatory Visit: Payer: Self-pay | Admitting: Radiology

## 2023-07-21 DIAGNOSIS — N2889 Other specified disorders of kidney and ureter: Secondary | ICD-10-CM

## 2023-07-21 NOTE — H&P (Signed)
Chief Complaint: Patient was seen in consultation today for renal mass biopsy   Referring Physician(s): Byrum,Robert S  Supervising Physician: Mir, Mauri Reading  Patient Status: [facility] - Out-pt  History of Present Illness: Ronald Atkinson is a 67 y.o. male with a medical history significant for anxiety, depression, bipolar disorder, DM, HTN, peripheral vascular disease and chronic kidney disease stage III.   During work up for nausea and vomiting with AKI he had a CT scan and was discovered to have multiple pulmonary nodules and a left renal vein thrombosis. He was referred to Pulmonology who ordered additional imaging. He was also started on Eliquis. The patient had a PET scan which showed an infiltrating left renal mass in addition to numerous bilateral pulmonary nodules.   NM PET 07/07/23 IMPRESSION: 1. Infiltrating left renal mass, suggesting renal cell carcinoma or less likely urothelial carcinoma, with renal vein invasion. 2. Numerous bilateral pulmonary nodules, measuring up to 11 mm in the left lower lobe. While only a few of these nodules are hypermetabolic, most are below the size threshold for PET sensitivity, and widespread pulmonary metastases are favored. 3. Multifocal osseous metastases, as above. 4. 9 mm short axis low right paratracheal node, likely reactive.  The patient has a pending bronchoscopy planned with Pulmonology. Interventional Radiology has been asked to evaluate this patient for a left renal mass biopsy. Imaging reviewed and procedure approved by Dr. Deanne Coffer.   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)   ANTERIOR CERVICAL DECOMP/DISCECTOMY FUSION N/A 08/12/2014   Procedure: Cervical four/five anterior cervical decompression with fusion interbody prosthesis plating and bonegraft with removal of old plate;  Surgeon: Tressie Stalker, MD;  Location: [facility];  Service: Neurosurgery;  Laterality: N/A;   ANTERIOR CERVICAL DECOMP/DISCECTOMY FUSION  2002   ANTERIOR CERVICAL DECOMP/DISCECTOMY FUSION N/A 04/13/2023   Procedure: CERVICAL THREE-FOUR ANTERIOR CERVICAL DECOMPRESSION/DISCETOMY FUSION WITH REMOVAL OF PLATE;  Surgeon: Tressie Stalker, MD;  Location: [facility] OR;  Service: Neurosurgery;  Laterality: N/A;  3C   CARPAL TUNNEL RELEASE     both wrists   EYE SURGERY     bil cataracts   I & D EXTREMITY Right 10/11/2017   Procedure: IRRIGATION AND DEBRIDEMENT RIGHT KNEE WITH WOUND CLOSURE;  Surgeon: Beverely Low, MD;  Location: [facility] OR;  Service: Orthopedics;  Laterality: Right;   TOTAL KNEE ARTHROPLASTY Right 10/07/2017   Procedure: RIGHT TOTAL KNEE ARTHROPLASTY;  Surgeon: Beverely Low, MD;  Location: [facility] OR;  Service: Orthopedics;  Laterality: Right;   ULNAR NERVE REPAIR Bilateral    right shoulder and neck (both arms)    Allergies: Bee venom, Penicillins, Diazepam, Doxycycline, Empagliflozin, Semaglutide, Gabapentin, Nsaids, Ozempic (0.25 or 0.5 mg-dose) [semaglutide(0.25 or 0.5mg -dos)], and Morphine  Medications: Prior to Admission medications   Medication Sig Start Date End Date Taking? Authorizing Provider  amLODipine (NORVASC) 10 MG tablet Take 10 mg by mouth daily.  [provider]  Apixaban (ELIQUIS PO) Take 5 mg by mouth 2 (two) times daily.    [provider]  Buprenorphine HCl 75 MCG FILM Place 75 mcg inside cheek 2 (two) times daily.    [provider]  buPROPion (WELLBUTRIN XL) 300 MG 24 hr tablet Take 300 mg by mouth daily.     [provider]  carboxymethylcellulose (REFRESH PLUS) 0.5 % SOLN Place 1 drop into both eyes 4 (four) times daily.    [provider]  cholecalciferol (VITAMIN D3) 25 MCG (1000 UNIT) tablet Take 1,000 Units by mouth daily.    [provider]  Continuous Blood Gluc Sensor MISC 1 each by Does not apply route as directed. Use as directed every 10 days. May dispense FreeStyle [employer] or similar. 10/21/17   Dixon, Katharina Caper, PA-C  cyclobenzaprine (FLEXERIL) 10 MG tablet Take 1 tablet (10 mg total) by mouth 3 (three) times daily as needed for muscle spasms. 04/14/23   Val Eagle D, NP  dextrose (GLUTOSE) 40 % GEL Take 1 Tube by mouth once as needed for low blood sugar.    [provider]  diclofenac Sodium (VOLTAREN) 1 % GEL Apply 1 Application topically 3 (three) times daily.    [provider]  docusate sodium (COLACE) 100 MG capsule Take 1 capsule (100 mg total) by mouth 2 (two) times daily. 04/14/23   Val Eagle D, NP  doxepin (SINEQUAN) 10 MG capsule Take 10 mg by mouth at bedtime.    [provider]  fluticasone (FLONASE) 50 MCG/ACT nasal spray Place 1 spray 2 (two) times daily as needed into both nostrils for allergies or rhinitis.    [provider]  glucose 4 GM chewable tablet Chew 4 tablets by mouth as needed for low blood sugar.    [provider]  hydrOXYzine (VISTARIL) 25 MG capsule Take 25-50 mg by mouth See admin instructions. Take 25 mg in the morning and 50 mg in the evening    [provider]  insulin aspart (NOVOLOG FLEXPEN) 100 UNIT/ML FlexPen Inject 6-10 Units into the skin See admin instructions. Inject 6 units with breakfast, 8 units with lunch, 10 units with dinner    [provider]  insulin glargine (LANTUS) 100 UNIT/ML injection Inject 22 Units into the skin daily.    [provider]  loratadine (CLARITIN) 10 MG tablet Take 10 mg daily as needed by mouth for  allergies.    [provider]  lurasidone (LATUDA) 40 MG TABS tablet Take 40 mg every evening by mouth.    [provider]  Maca Root (MACA PO) Take 1 capsule by mouth daily.    [provider]  Magnesium Oxide 420 MG TABS Take 420 mg 2 (two) times daily by mouth.    [provider]  naloxone [facility]) nasal spray 4 mg/0.1 mL Place 1 spray into the nose as needed (opioid overdose).    [provider]  nutrition supplement, JUVEN, (JUVEN) PACK Take 1 packet by mouth 2 (two) times daily between meals.    [provider]  OVER THE COUNTER MEDICATION Take 3 tablets by mouth daily. Red wood supplement    [provider]  OVER THE COUNTER MEDICATION Take 2 tablets by mouth daily. Sea moss supplement    [provider]  oxyCODONE-acetaminophen (PERCOCET) 5-325 MG tablet Take 1 tablet by mouth every 4 (four) hours as needed for severe pain (Postoperative pain). 04/14/23 04/13/24  Val Eagle  D, NP  polyethylene glycol (MIRALAX / GLYCOLAX) 17 g packet Take 17 g by mouth daily.    [provider]  sildenafil (VIAGRA) 100 MG tablet Take 100 mg by mouth daily as needed for erectile dysfunction.    [provider]  spironolactone (ALDACTONE) 25 MG tablet Take 25 mg by mouth daily.    [provider]     Family History  Problem Relation Age of Onset   Diabetes Mother    Diabetes Father     Social History   Socioeconomic History   Marital status: Single    Spouse name: Not on file   Number of children: 4   Years of education: Not on file   Highest education level: Not on file  Occupational History   Not on file  Tobacco Use   Smoking status: Former    Types: Cigarettes    Passive exposure: Never   Smokeless tobacco: Never  Vaping Use   Vaping status: Never Used  Substance and Sexual Activity   Alcohol use: No   Drug use: Yes    Types: Marijuana    Comment: smokes marijuana every day   Sexual  activity: Not on file  Other Topics Concern   Not on file  Social History Narrative   Not on file   Social Determinants of Health   Financial Resource Strain: Not on file  Food Insecurity: No Food Insecurity (06/25/2023)   Received from [facility]   Hunger Vital Sign    Worried About Running Out of Food in the Last Year: Never true    Ran Out of Food in the Last Year: Never true  Transportation Needs: No Transportation Needs (06/25/2023)   Received from [facility] - Transportation    Lack of Transportation (Medical): No    Lack of Transportation (Non-Medical): No  Physical Activity: Not on file  Stress: No Stress Concern Present (06/25/2023)   Received from [facility] - Occupational Stress Questionnaire    Feeling of Stress : Not at all  Social Connections: Unknown (04/27/2022)   Received from [facility]   Social Network    Social Network: Not on file    Review of Systems: A 12 point ROS discussed and pertinent positives are indicated in the HPI above.  All other systems are negative.  Review of Systems  Constitutional:  Positive for appetite change. Negative for fatigue.  Respiratory:  Negative for cough and shortness of breath.   Cardiovascular:  Negative for chest pain and leg swelling.  Gastrointestinal:  Negative for abdominal pain, diarrhea, nausea and vomiting.  Neurological:  Negative for dizziness and headaches.    Vital Signs: BP (!) 140/87   Pulse 81   Temp (!) 97.5 F (36.4 C) (Temporal)   Resp 17   Ht 5\' 10"  (1.778 m)   Wt 211 lb (95.7 kg)   SpO2 99%   BMI 30.28 kg/m   Physical Exam Constitutional:      General: He is not in acute distress.    Appearance: He is not ill-appearing.  HENT:     Mouth/Throat:     Mouth: Mucous membranes are moist.     Pharynx: Oropharynx is clear.  Cardiovascular:     Rate and Rhythm: Normal rate and regular rhythm.     Pulses: Normal  pulses.     Heart sounds: Normal heart sounds.  Pulmonary:     Effort: Pulmonary  effort is normal.     Breath sounds: Normal breath sounds.  Abdominal:     General: Bowel sounds are normal.     Palpations: Abdomen is soft.     Tenderness: There is no abdominal tenderness.  Musculoskeletal:     Right lower leg: No edema.     Left lower leg: No edema.  Skin:    General: Skin is warm and dry.  Neurological:     Mental Status: He is alert and oriented to person, place, and time.  Psychiatric:        Mood and Affect: Mood normal.        Behavior: Behavior normal.        Thought Content: Thought content normal.        Judgment: Judgment normal.     Imaging: NM PET Image Initial (PI) Skull Base To Thigh  Result Date: 07/10/2023 CLINICAL DATA:  Initial treatment strategy for multiple pulmonary nodules. EXAM: NUCLEAR MEDICINE PET SKULL BASE TO THIGH TECHNIQUE: 10.7 mCi F-18 FDG was injected intravenously. Full-ring PET imaging was performed from the skull base to thigh after the radiotracer. CT data was obtained and used for attenuation correction and anatomic localization. Fasting blood glucose: 170 mg/dl COMPARISON:  Outside hospital [facility]) CT chest dated 05/11/2023 FINDINGS: Mediastinal blood pool activity: SUV max 2.2 Liver activity: SUV max NA NECK: No hypermetabolic cervical lymphadenopathy. Incidental CT findings: None. CHEST: 9 mm short axis low right paratracheal node (series 4/image 6), max SUV 2.6. Numerous bilateral pulmonary nodules, most of which are non FDG avid but beneath the size threshold for PET sensitivity. An 11 mm nodule in the medial left lower lobe (series 7/image 49) demonstrates mild hypermetabolism, max SUV 2.9. Additionally, there are clustered posterior right upper lobe nodules measuring 11 mm in aggregate (series 7/image 17), max SUV 2.2. This appearance favors metastatic disease is given additional findings (described below). Incidental CT findings: None.  ABDOMEN/PELVIS: Enlargement of the left kidney secondary to infiltrating renal mass, max SUV 7.9. Expansile left renal vein with renal vein invasion, max SUV 9.6. No abnormal hypermetabolism in the liver, spleen, pancreas, or adrenal glands. No hypermetabolic abdominopelvic lymphadenopathy. Incidental CT findings: Cholelithiasis. Atherosclerotic calcifications of the abdominal aorta and branch vessels. Small hepatic cyst, benign. SKELETON: Multifocal osseous metastases, including: --Right T12 vertebral body, max SUV 6.9 --Right iliac bone, max SUV 6.2 --Left sacrum, max SUV 7.1 --Anterior column of the right acetabulum, max SUV 5.4 --Left femoral neck, max SUV 5.3 Incidental CT findings: Degenerative changes of the thoracic spine. IMPRESSION: Infiltrating left renal mass, suggesting renal cell carcinoma or less likely urothelial carcinoma, with renal vein invasion. Numerous bilateral pulmonary nodules, measuring up to 11 mm in the left lower lobe. While only a few of these nodules are hypermetabolic, most are below the size threshold for PET sensitivity, and widespread pulmonary metastases are favored. Multifocal osseous metastases, as above. 9 mm short axis low right paratracheal node, likely reactive. Electronically Signed   By: Charline Bills M.D.   On: 07/10/2023 02:40    Labs:  CBC: Recent Labs    04/07/23 0835 07/22/23 1017  WBC 7.2 9.6  HGB 14.0 14.1  HCT 42.2 42.2  PLT 376 385    COAGS: No results for input(s): "INR", "APTT" in the last 8760 hours.  BMP: Recent Labs    04/07/23 0835  NA 135  K 4.7  CL 101  CO2 26  GLUCOSE 155*  BUN 16  CALCIUM 9.2  CREATININE 1.84*  GFRNONAA 40*    LIVER FUNCTION TESTS: No results for input(s): "BILITOT", "AST", "ALT", "ALKPHOS", "PROT", "ALBUMIN" in the last 8760 hours.  TUMOR MARKERS: Recent Labs    06/22/23 1352  CEA <2.0    Assessment and Plan:  Left renal mass; pulmonary nodules: Beverly Milch. Weckesser, 67 year old male,  presents today to the [facility] Interventional Radiology department for an image-guided left renal mass biopsy.   Risks and benefits of this procedure were discussed with the patient and/or patient's family including, but not limited to bleeding, infection, damage to adjacent structures or low yield requiring additional tests.  All of the questions were answered and there is agreement to proceed. He has been NPO. He is a full code. His last dose of Eliquis was Wednesday 07/20/23.  Consent signed and in chart.  Thank you for this interesting consult.  I greatly enjoyed meeting OMANI LANTON and look forward to participating in their care.  A copy of this report was sent to the requesting provider on this date.  Electronically Signed: Alwyn Ren, AGACNP-BC [phone] 07/22/2023, 11:06 AM   I spent a total of  30 Minutes   in face to face in clinical consultation, greater than 50% of which was counseling/coordinating care for renal mass biopsy

## 2023-07-22 ENCOUNTER — Other Ambulatory Visit (HOSPITAL_COMMUNITY): Admission: RE | Admit: 2023-07-22 | Payer: No Typology Code available for payment source | Source: Ambulatory Visit

## 2023-07-22 ENCOUNTER — Ambulatory Visit (HOSPITAL_COMMUNITY)
Admission: RE | Admit: 2023-07-22 | Discharge: 2023-07-22 | Disposition: A | Discharge status: Home / Self Care | Payer: No Typology Code available for payment source | Source: Ambulatory Visit | Attending: Emergency Medicine | Admitting: Emergency Medicine

## 2023-07-22 ENCOUNTER — Encounter (HOSPITAL_COMMUNITY): Payer: Self-pay | Admitting: *Deleted

## 2023-07-22 DIAGNOSIS — E1136 Type 2 diabetes mellitus with diabetic cataract: Secondary | ICD-10-CM | POA: Insufficient documentation

## 2023-07-22 DIAGNOSIS — E1151 Type 2 diabetes mellitus with diabetic peripheral angiopathy without gangrene: Secondary | ICD-10-CM | POA: Diagnosis not present

## 2023-07-22 DIAGNOSIS — N183 Chronic kidney disease, stage 3 unspecified: Secondary | ICD-10-CM | POA: Insufficient documentation

## 2023-07-22 DIAGNOSIS — N2889 Other specified disorders of kidney and ureter: Secondary | ICD-10-CM

## 2023-07-22 DIAGNOSIS — F419 Anxiety disorder, unspecified: Secondary | ICD-10-CM | POA: Insufficient documentation

## 2023-07-22 DIAGNOSIS — E1122 Type 2 diabetes mellitus with diabetic chronic kidney disease: Secondary | ICD-10-CM | POA: Insufficient documentation

## 2023-07-22 DIAGNOSIS — C649 Malignant neoplasm of unspecified kidney, except renal pelvis: Secondary | ICD-10-CM | POA: Insufficient documentation

## 2023-07-22 DIAGNOSIS — I129 Hypertensive chronic kidney disease with stage 1 through stage 4 chronic kidney disease, or unspecified chronic kidney disease: Secondary | ICD-10-CM | POA: Diagnosis not present

## 2023-07-22 DIAGNOSIS — F319 Bipolar disorder, unspecified: Secondary | ICD-10-CM | POA: Diagnosis not present

## 2023-07-22 LAB — GLUCOSE, CAPILLARY
Glucose-Capillary: 80 mg/dL (ref 70–99)
Glucose-Capillary: 67 mg/dL — ABNORMAL LOW (ref 70–99)
Glucose-Capillary: 155 mg/dL — ABNORMAL HIGH (ref 70–99)

## 2023-07-22 LAB — PROTIME-INR
INR: 1.1 (ref 0.8–1.2)
Prothrombin Time: 14.3 s (ref 11.4–15.2)

## 2023-07-22 LAB — POCT I-STAT CREATININE: Creatinine, Ser: 2.3 mg/dL — ABNORMAL HIGH (ref 0.61–1.24)

## 2023-07-22 LAB — CBC
HCT: 42.2 % (ref 39.0–52.0)
Hemoglobin: 14.1 g/dL (ref 13.0–17.0)
MCH: 28.6 pg (ref 26.0–34.0)
MCHC: 33.4 g/dL (ref 30.0–36.0)
MCV: 85.6 fL (ref 80.0–100.0)
Platelets: 385 10*3/uL (ref 150–400)
RBC: 4.93 MIL/uL (ref 4.22–5.81)
RDW: 15.8 % — ABNORMAL HIGH (ref 11.5–15.5)
WBC: 9.6 10*3/uL (ref 4.0–10.5)
nRBC: 0 % (ref 0.0–0.2)

## 2023-07-22 MED ORDER — FENTANYL CITRATE (PF) 100 MCG/2ML IJ SOLN
INTRAMUSCULAR | Status: AC
  Filled 2023-07-22: qty 2

## 2023-07-22 MED ORDER — IOHEXOL 350 MG/ML SOLN
50.0000 mL | Freq: Once | INTRAVENOUS | Status: AC | PRN
  Administered 2023-07-22: 50 mL via INTRAVENOUS

## 2023-07-22 MED ORDER — SODIUM CHLORIDE 0.9 % IV SOLN: INTRAVENOUS | Status: DC

## 2023-07-22 MED ORDER — MIDAZOLAM HCL 2 MG/2ML IJ SOLN
INTRAMUSCULAR | Status: AC
  Filled 2023-07-22: qty 2

## 2023-07-22 MED ORDER — SODIUM CHLORIDE 0.9 % IV SOLN
INTRAVENOUS | Status: AC | PRN
Start: 2023-07-22 — End: 2023-07-22
  Administered 2023-07-22: 10 mL/h via INTRAVENOUS

## 2023-07-22 NOTE — Sedation Documentation (Signed)
02 d/c

## 2023-07-22 NOTE — Progress Notes (Signed)
Paige RN in [place] Stay aware that I spoke to Dr Bryn Gulling and  that patient can restart Eliquis tomorrow.

## 2023-07-22 NOTE — Procedures (Signed)
Interventional Radiology Procedure Note  Procedure: CT guided left renal mass biopsy  Indication: Left renal mass  Findings: Please refer to procedural dictation for full description.  Complications: None  EBL: < 10 mL  Acquanetta Belling, MD [phone]

## 2023-07-25 ENCOUNTER — Encounter: Payer: Self-pay | Admitting: Adult Health

## 2023-07-26 LAB — SURGICAL PATHOLOGY

## 2023-07-27 ENCOUNTER — Ambulatory Visit: Payer: Non-veteran care | Admitting: Nurse Practitioner

## 2024-06-08 DEATH — deceased
